# Patient Record
Sex: Female | Born: 1960 | Race: Black or African American | Hispanic: No | Marital: Single | Smoking: Never smoker
Health system: Southern US, Community
[De-identification: ages and names within clinical notes are randomized; demographics above are authoritative.]

## PROBLEM LIST (undated history)

## (undated) DIAGNOSIS — J189 Pneumonia, unspecified organism: Secondary | ICD-10-CM

## (undated) DIAGNOSIS — J690 Pneumonitis due to inhalation of food and vomit: Secondary | ICD-10-CM

## (undated) DIAGNOSIS — I3139 Other pericardial effusion (noninflammatory): Secondary | ICD-10-CM

## (undated) DIAGNOSIS — E0500 Thyrotoxicosis with diffuse goiter without thyrotoxic crisis or storm: Secondary | ICD-10-CM

## (undated) DIAGNOSIS — E039 Hypothyroidism, unspecified: Secondary | ICD-10-CM

## (undated) DIAGNOSIS — I313 Pericardial effusion (noninflammatory): Secondary | ICD-10-CM

## (undated) HISTORY — PX: COLONOSCOPY: SHX174

---

## 1989-03-22 HISTORY — PX: DIAGNOSTIC LAPAROSCOPY WITH REMOVAL OF ECTOPIC PREGNANCY: SHX6449

## 2014-06-21 ENCOUNTER — Other Ambulatory Visit: Payer: Self-pay | Admitting: Obstetrics and Gynecology

## 2014-06-21 DIAGNOSIS — Z1231 Encounter for screening mammogram for malignant neoplasm of breast: Secondary | ICD-10-CM

## 2014-07-19 ENCOUNTER — Encounter (INDEPENDENT_AMBULATORY_CARE_PROVIDER_SITE_OTHER): Payer: Self-pay

## 2014-07-19 ENCOUNTER — Ambulatory Visit
Admission: RE | Admit: 2014-07-19 | Discharge: 2014-07-19 | Disposition: A | Discharge status: Home / Self Care | Payer: 59 | Source: Ambulatory Visit | Attending: Obstetrics and Gynecology | Admitting: Obstetrics and Gynecology

## 2014-07-19 DIAGNOSIS — Z1231 Encounter for screening mammogram for malignant neoplasm of breast: Secondary | ICD-10-CM

## 2014-12-06 ENCOUNTER — Other Ambulatory Visit: Payer: Self-pay | Admitting: Obstetrics and Gynecology

## 2014-12-06 DIAGNOSIS — E049 Nontoxic goiter, unspecified: Secondary | ICD-10-CM

## 2014-12-09 ENCOUNTER — Ambulatory Visit
Admission: RE | Admit: 2014-12-09 | Discharge: 2014-12-09 | Disposition: A | Discharge status: Home / Self Care | Payer: 59 | Source: Ambulatory Visit | Attending: Obstetrics and Gynecology | Admitting: Obstetrics and Gynecology

## 2014-12-09 DIAGNOSIS — E049 Nontoxic goiter, unspecified: Secondary | ICD-10-CM

## 2014-12-13 ENCOUNTER — Other Ambulatory Visit: Payer: Self-pay | Admitting: Internal Medicine

## 2014-12-13 DIAGNOSIS — E041 Nontoxic single thyroid nodule: Secondary | ICD-10-CM

## 2014-12-13 DIAGNOSIS — E0510 Thyrotoxicosis with toxic single thyroid nodule without thyrotoxic crisis or storm: Secondary | ICD-10-CM

## 2014-12-29 ENCOUNTER — Ambulatory Visit
Admission: RE | Admit: 2014-12-29 | Discharge: 2014-12-29 | Disposition: A | Discharge status: Home / Self Care | Payer: 59 | Source: Ambulatory Visit | Attending: Internal Medicine | Admitting: Internal Medicine

## 2014-12-29 ENCOUNTER — Other Ambulatory Visit (HOSPITAL_COMMUNITY)
Admission: RE | Admit: 2014-12-29 | Discharge: 2014-12-29 | Disposition: A | Discharge status: Home / Self Care | Payer: 59 | Source: Ambulatory Visit | Attending: Interventional Radiology | Admitting: Interventional Radiology

## 2014-12-29 DIAGNOSIS — E041 Nontoxic single thyroid nodule: Secondary | ICD-10-CM | POA: Diagnosis present

## 2015-01-03 ENCOUNTER — Encounter (HOSPITAL_COMMUNITY)
Admission: RE | Admit: 2015-01-03 | Discharge: 2015-01-03 | Disposition: A | Discharge status: Home / Self Care | Payer: 59 | Source: Ambulatory Visit | Attending: Internal Medicine | Admitting: Internal Medicine

## 2015-01-03 DIAGNOSIS — E0520 Thyrotoxicosis with toxic multinodular goiter without thyrotoxic crisis or storm: Secondary | ICD-10-CM | POA: Insufficient documentation

## 2015-01-03 DIAGNOSIS — E0510 Thyrotoxicosis with toxic single thyroid nodule without thyrotoxic crisis or storm: Secondary | ICD-10-CM

## 2015-01-04 ENCOUNTER — Encounter (HOSPITAL_COMMUNITY)
Admission: RE | Admit: 2015-01-04 | Discharge: 2015-01-04 | Disposition: A | Discharge status: Home / Self Care | Payer: 59 | Source: Ambulatory Visit | Attending: Internal Medicine | Admitting: Internal Medicine

## 2015-01-04 DIAGNOSIS — E0520 Thyrotoxicosis with toxic multinodular goiter without thyrotoxic crisis or storm: Secondary | ICD-10-CM | POA: Diagnosis not present

## 2015-01-04 DIAGNOSIS — E0510 Thyrotoxicosis with toxic single thyroid nodule without thyrotoxic crisis or storm: Secondary | ICD-10-CM | POA: Diagnosis present

## 2015-01-04 MED ORDER — SODIUM IODIDE I 131 CAPSULE
7.3000 | Freq: Once | INTRAVENOUS | Status: AC | PRN
  Administered 2015-01-04: 7.3 via ORAL

## 2015-01-04 MED ORDER — SODIUM PERTECHNETATE TC 99M INJECTION
10.3000 | Freq: Once | INTRAVENOUS | Status: AC | PRN
  Administered 2015-01-04: 10 via INTRAVENOUS

## 2015-02-13 ENCOUNTER — Encounter: Payer: Self-pay | Admitting: General Surgery

## 2015-02-13 NOTE — Progress Notes (Signed)
Carla Dixon 02/13/2015 11:22 AM Location: [facility] Patient #: [id number] DOB: 08/22/1960 Single / Language: Carla Dixon / Race: Black or African American Female History of Present Illness Carla Hollingshead MD; 02/13/2015 12:20 PM) Patient words: thyroid cancer.  The patient is a 54 year old female    Note:She is referred by Dr. Jeanann Lewandowsky because of a thyroid nodule on the left side that is highly suspicious for papillary thyroid cancer (Bethesda level V) as well as Graves' disease. Her gynecologist noted some fullness in her neck. Ultrasound of the neck demonstrated a diffusely enlarged heterogeneous and hypervascular thyroid gland. It also showed 2 dominant nodules in the left gland. Biopsy was performed. One nodule demonstrated follicular cells and the other demonstrated cells highly concerning for papillary cancer. She has been diagnosed with Graves' disease as her T4 was elevated and TSH was low. Thyroid peroxidase antibody was elevated. She currently is taking Methimazole and has no outward symptoms of hyperthyroidism. No hoarseness. No difficulty with swallowing. No exposure to ionizing radiation at a high-dose. No family history of thyroid disease. She recently moved here from [place].  Other Problems Carla Dixon, [place]; 02/13/2015 11:22 AM) No pertinent past medical history  Past Surgical History Carla Dixon, [place]; 02/13/2015 11:22 AM) Colon Polyp Removal - Colonoscopy  Diagnostic Studies History Carla Dixon, CMA; 02/13/2015 11:22 AM) Colonoscopy 1-5 years ago Mammogram within last year Pap Smear 1-5 years ago  Allergies Carla Dixon, CMA; 02/13/2015 11:24 AM) Neosporin AF *DERMATOLOGICALS* Rash.  Medication History Carla Dixon, CMA; 02/13/2015 11:23 AM) Methimazole (10MG  Tablet, 2 tablets Oral twice daily) Active. Medications Reconciled  Social History Carla Dixon, CMA; 02/13/2015 11:22 AM) Caffeine use Carbonated  beverages, Tea. No alcohol use No drug use Tobacco use Never smoker.  Family History Carla Dixon, [place]; 02/13/2015 11:22 AM) Arthritis Mother. Colon Cancer Father. Colon Polyps Mother. Diabetes Mellitus Mother. Hypertension Brother, Father, Mother, Sister.  Pregnancy / Birth History Carla Dixon, [place]; 02/13/2015 11:22 AM) Age at menarche 76 years. Gravida 1 Irregular periods Maternal age 40-30 Para 0     Review of Systems Carla Dixon CMA; 02/13/2015 11:22 AM) General Present- Fatigue, Night Sweats and Weight Gain. Not Present- Appetite Loss, Chills, Fever and Weight Loss. Skin Present- Rash. Not Present- Change in Wart/Mole, Dryness, Hives, Jaundice, New Lesions, Non-Healing Wounds and Ulcer. HEENT Not Present- Earache, Hearing Loss, Hoarseness, Nose Bleed, Oral Ulcers, Ringing in the Ears, Seasonal Allergies, Sinus Pain, Sore Throat, Visual Disturbances, Wears glasses/contact lenses and Yellow Eyes. Respiratory Not Present- Bloody sputum, Chronic Cough, Difficulty Breathing, Snoring and Wheezing. Breast Not Present- Breast Mass, Breast Pain, Nipple Discharge and Skin Changes. Cardiovascular Not Present- Chest Pain, Difficulty Breathing Lying Down, Leg Cramps, Palpitations, Rapid Heart Rate, Shortness of Breath and Swelling of Extremities. Gastrointestinal Not Present- Abdominal Pain, Bloating, Bloody Stool, Change in Bowel Habits, Chronic diarrhea, Constipation, Difficulty Swallowing, Excessive gas, Gets full quickly at meals, Hemorrhoids, Indigestion, Nausea, Rectal Pain and Vomiting. Female Genitourinary Not Present- Frequency, Nocturia, Painful Urination, Pelvic Pain and Urgency. Musculoskeletal Present- Muscle Weakness. Not Present- Back Pain, Joint Pain, Joint Stiffness, Muscle Pain and Swelling of Extremities. Neurological Not Present- Decreased Memory, Fainting, Headaches, Numbness, Seizures, Tingling, Tremor, Trouble walking and Weakness. Psychiatric Not  Present- Anxiety, Bipolar, Change in Sleep Pattern, Depression, Fearful and Frequent crying. Endocrine Present- Heat Intolerance. Not Present- Cold Intolerance, Excessive Hunger, Hair Changes, Hot flashes and New Diabetes. Hematology Not Present- Easy Bruising, Excessive bleeding, Gland problems, HIV and Persistent Infections.  Vitals Carla Dixon  CMA; 02/13/2015 11:25 AM) 02/13/2015 11:24 AM Weight: 159 lb Height: 58.5in Body Surface Area: 1.73 m Body Mass Index: 32.67 kg/m Temp.: 98.82F(Oral)  Pulse: 64 (Regular)  Resp.: 20 (Unlabored)  BP: 138/70 (Sitting, Left Arm, Standard)     Physical Exam Carla Hollingshead MD; 02/13/2015 12:21 PM)  The physical exam findings are as follows: Note:General: WDWN in NAD. Pleasant and cooperative.  HEENT: [place]/AT, no facial masses  EYES: EOMI, no icterus  NECK: Supple, diffuse thyroid enlargement.  CV: RRR, no murmur, no JVD.  CHEST: Breath sounds equal and clear. Respirations nonlabored.  ABDOMEN: Soft, nontender  MUSCULOSKELETAL: FROM, good muscle tone, no edema, no venous stasis changes  LYMPHATIC: No palpable cervical, supraclavicular adenopathy.  NEUROLOGIC: Alert and oriented, answers questions appropriately, normal gait and station.  PSYCHIATRIC: Normal mood, affect , and behavior.    Assessment & Plan Carla Hollingshead MD; 02/13/2015 11:56 AM)  THYROID NODULE, TOXIC OR WITH HYPERTHYROIDISM (242.10  E05.10) Impression: She has findings consistent with Graves' disease and is on Methimazole. The left thyroid nodule is highly suspicious for papillary thyroid cancer with Bethesda category 5.  [place], I recommended a total thyroidectomy. We'll need potassium iodide preoperatively. We discussed the procedure rationale and risks in detail. The risks of thyroid surgery include but not limited to bleeding, infection, wound healing problems, presence of scar, anesthesia, injury to recurrent laryngeal nerve and permanent  hoarseness, permanent hypocalcemia, voice changes, dysphagia. She seems to understand and agrees with the plan.  Jackolyn Confer, MD

## 2015-03-22 ENCOUNTER — Other Ambulatory Visit: Payer: Self-pay | Admitting: Obstetrics and Gynecology

## 2015-03-22 DIAGNOSIS — N6313 Unspecified lump in the right breast, lower outer quadrant: Secondary | ICD-10-CM

## 2015-03-23 ENCOUNTER — Other Ambulatory Visit (HOSPITAL_COMMUNITY): Payer: Self-pay | Admitting: *Deleted

## 2015-03-23 ENCOUNTER — Encounter (HOSPITAL_COMMUNITY): Payer: Self-pay

## 2015-03-23 ENCOUNTER — Encounter (HOSPITAL_COMMUNITY)
Admission: RE | Admit: 2015-03-23 | Discharge: 2015-03-23 | Disposition: A | Discharge status: Home / Self Care | Payer: 59 | Source: Ambulatory Visit | Attending: General Surgery | Admitting: General Surgery

## 2015-03-23 ENCOUNTER — Ambulatory Visit (HOSPITAL_COMMUNITY)
Admission: RE | Admit: 2015-03-23 | Discharge: 2015-03-23 | Disposition: A | Discharge status: Home / Self Care | Payer: 59 | Source: Ambulatory Visit | Attending: Anesthesiology | Admitting: Anesthesiology

## 2015-03-23 ENCOUNTER — Other Ambulatory Visit (HOSPITAL_COMMUNITY): Payer: 59

## 2015-03-23 DIAGNOSIS — Z01812 Encounter for preprocedural laboratory examination: Secondary | ICD-10-CM | POA: Insufficient documentation

## 2015-03-23 DIAGNOSIS — C73 Malignant neoplasm of thyroid gland: Secondary | ICD-10-CM

## 2015-03-23 DIAGNOSIS — Z01818 Encounter for other preprocedural examination: Secondary | ICD-10-CM

## 2015-03-23 DIAGNOSIS — E0500 Thyrotoxicosis with diffuse goiter without thyrotoxic crisis or storm: Secondary | ICD-10-CM | POA: Diagnosis not present

## 2015-03-23 DIAGNOSIS — Z79899 Other long term (current) drug therapy: Secondary | ICD-10-CM | POA: Diagnosis not present

## 2015-03-23 HISTORY — DX: Other pericardial effusion (noninflammatory): I31.39

## 2015-03-23 HISTORY — DX: Pneumonia, unspecified organism: J18.9

## 2015-03-23 HISTORY — DX: Pericardial effusion (noninflammatory): I31.3

## 2015-03-23 HISTORY — DX: Pneumonitis due to inhalation of food and vomit: J69.0

## 2015-03-23 LAB — CBC WITH DIFFERENTIAL/PLATELET
BASOS ABS: 0 10*3/uL (ref 0.0–0.1)
Basophils Relative: 1 % (ref 0–1)
EOS PCT: 3 % (ref 0–5)
Eosinophils Absolute: 0.1 10*3/uL (ref 0.0–0.7)
HCT: 43.8 % (ref 36.0–46.0)
HEMOGLOBIN: 14.7 g/dL (ref 12.0–15.0)
LYMPHS PCT: 51 % — AB (ref 12–46)
Lymphs Abs: 2.2 10*3/uL (ref 0.7–4.0)
MCH: 27.1 pg (ref 26.0–34.0)
MCHC: 33.6 g/dL (ref 30.0–36.0)
MCV: 80.7 fL (ref 78.0–100.0)
Monocytes Absolute: 0.3 10*3/uL (ref 0.1–1.0)
Monocytes Relative: 7 % (ref 3–12)
NEUTROS ABS: 1.6 10*3/uL — AB (ref 1.7–7.7)
NEUTROS PCT: 38 % — AB (ref 43–77)
PLATELETS: 335 10*3/uL (ref 150–400)
RBC: 5.43 MIL/uL — AB (ref 3.87–5.11)
RDW: 16.2 % — ABNORMAL HIGH (ref 11.5–15.5)
WBC: 4.2 10*3/uL (ref 4.0–10.5)

## 2015-03-23 LAB — HCG, SERUM, QUALITATIVE: PREG SERUM: NEGATIVE

## 2015-03-23 LAB — COMPREHENSIVE METABOLIC PANEL
ALK PHOS: 279 U/L — AB (ref 38–126)
ALT: 80 U/L — AB (ref 14–54)
AST: 55 U/L — AB (ref 15–41)
Albumin: 4.2 g/dL (ref 3.5–5.0)
Anion gap: 9 (ref 5–15)
BUN: 8 mg/dL (ref 6–20)
CHLORIDE: 103 mmol/L (ref 101–111)
CO2: 25 mmol/L (ref 22–32)
Calcium: 9.3 mg/dL (ref 8.9–10.3)
Creatinine, Ser: 0.69 mg/dL (ref 0.44–1.00)
GFR calc Af Amer: >60 mL/min (ref >60)
Glucose, Bld: 82 mg/dL (ref 65–99)
Potassium: 4.2 mmol/L (ref 3.5–5.1)
Sodium: 137 mmol/L (ref 135–145)
Total Bilirubin: 0.6 mg/dL (ref 0.3–1.2)
Total Protein: 7.3 g/dL (ref 6.5–8.1)

## 2015-03-23 LAB — PROTIME-INR
INR: 1.04 (ref 0.00–1.49)
PROTHROMBIN TIME: 13.8 s (ref 11.6–15.2)

## 2015-03-23 NOTE — Progress Notes (Signed)
Requested last OV notes and lab results from Dr. Foye Spurling office.

## 2015-03-23 NOTE — Progress Notes (Signed)
During PAT appt pt states she had pericardial effusion and a pleural effusion approx 5-6 years ago. Was treated at hospital in [facility], [facility]) - will request records from that visit. Pt denies any recent chest pain or sob. She states she occasionally feels jittery and the heat really bothers her - sweating heavily in the heat.

## 2015-03-23 NOTE — Pre-Procedure Instructions (Signed)
Carla Dixon  03/23/2015     Your procedure is scheduled on Monday, April 03, 2015 at 7:30 AM.   Report to [facility] Entrance "A" Admitting Office at 5:30 AM.   Call this number if you have problems the morning of surgery: [phone]   Any questions prior to day of surgery, please call [phone] between 8 & 4 PM.   Remember:  Do not eat food or drink liquids after midnight, Sunday, 04/02/15.  Take these medicines the morning of surgery with A SIP OF WATER: Tapazole (methmazole), Potassium Iodide   Do not wear jewelry, make-up or nail polish.  Do not wear lotions, powders, or perfumes.  You may wear deodorant.  Do not shave 48 hours prior to surgery.    Do not bring valuables to the hospital.  [facility] is not responsible for any belongings or valuables.  Contacts, dentures or bridgework may not be worn into surgery.  Leave your suitcase in the car.  After surgery it may be brought to your room.  For patients admitted to the hospital, discharge time will be determined by your treatment team.  Special instructions:  [place] - Preparing for Surgery  Before surgery, you can play an important role.  Because skin is not sterile, your skin needs to be as free of germs as possible.  You can reduce the number of germs on you skin by washing with CHG (chlorahexidine gluconate) soap before surgery.  CHG is an antiseptic cleaner which kills germs and bonds with the skin to continue killing germs even after washing.  Please DO NOT use if you have an allergy to CHG or antibacterial soaps.  If your skin becomes reddened/irritated stop using the CHG and inform your nurse when you arrive at Short Stay.  Do not shave (including legs and underarms) for at least 48 hours prior to the first CHG shower.  You may shave your face.  Please follow these instructions carefully:   1.  Shower with CHG Soap the night before surgery and the                                morning of  Surgery.  2.  If you choose to wash your hair, wash your hair first as usual with your       normal shampoo.  3.  After you shampoo, rinse your hair and body thoroughly to remove the                      Shampoo.  4.  Use CHG as you would any other liquid soap.  You can apply chg directly       to the skin and wash gently with scrungie or a clean washcloth.  5.  Apply the CHG Soap to your body ONLY FROM THE NECK DOWN.        Do not use on open wounds or open sores.  Avoid contact with your eyes, ears, mouth and genitals (private parts).  Wash genitals (private parts) with your normal soap.  6.  Wash thoroughly, paying special attention to the area where your surgery        will be performed.  7.  Thoroughly rinse your body with warm water from the neck down.  8.  DO NOT shower/wash with your normal soap after using and rinsing off       the CHG  Soap.  9.  Pat yourself dry with a clean towel.            10.  Wear clean pajamas.            11.  Place clean sheets on your bed the night of your first shower and do not        sleep with pets.  Day of Surgery  Do not apply any lotions the morning of surgery.  Please wear clean clothes to the hospital.    Please read over the following fact sheets that you were given. Pain Booklet, Coughing and Deep Breathing and Surgical Site Infection Prevention

## 2015-03-23 NOTE — Progress Notes (Addendum)
Anesthesia PAT Evaluation: Patient is a 54 year old female scheduled for total thyroidectomy on 04/03/15 by Dr. Zella Richer.  History includes non-smoker, Graves' disease with hyperthyroidism recently with FNA findings suspicious for papillary thyroid carcinoma, ectopic pregnancy surgery. Hospitalization near [facility], [facility]) with pericardial effusion and pleural effusion 5-6 years ago. She reports she presented to her PCP with SOB with minimal exertion. She was then sent to a cardiologist, who ordered an echo showing a pericardial effusion. She was sent straight to the ED for admission and pericardiocentesis. She spent two weeks (some of which in the ICU in "shock") in the hospital and also had to undergo thoracentesis. Reportedly, no etiology was found. She was followed a cardiology for a few years afterwards, but was reportedly released by Dr. Leatha Gilding [phone]) around early 2014.). Internist then was Dr. Tressa Busman.   BMI is consistent with obesity. PCP is currently Dr. Glendon Axe with [facility]. She reports either a visit or testing at [facility] within the past year (she thinks EKG, CXR, and echo). Endocrinologist is Dr. Jeanann Lewandowsky. GYN is Dr. Everett Graff.  PAT Vitals: T 36.8, RR 16, HR 69, BP 139/69, 99% RA. Patient says she is prone to sweat while in the heat, but otherwise no tremors (stopped after starting methimazole), tachy-palpitations, diarrhea, insomnia, SOB, chest pain.   Exam showed a pleasant black female in NAD. HT 4' 10.5". WT 171 lb. No tremor or exopthalmus noted. She does have a short neck with prominent upper teeth. Mallampati appeared to be Class III. Lungs clear. Heart RRR. I did no appreciate a murmur. No pedal edema. She denied history of anesthesia complication or difficult intubation. We did discuss need for GETA for this procedure.   Meds include methimazole. She is to start potassium iodine 10 days prior to  surgery.   12/29/14 left lobe inferior FNA showed atypia of undetermined significant or follicular lesion of undetermined significance. Left lobe superior FNA showed findings suspicious for papillary thyroid carcinoma.   12/09/14 Thyroid U/S: IMPRESSION: 1. Diffusely enlarged, heterogeneous and hypervascular thyroid gland. Recommend clinical correlation for signs and symptoms of hyperthyroidism. 2. There are 2 dominant nodules in the left gland which are very similar in character. Findings meet consensus criteria for biopsy. Ultrasound-guided fine needle aspiration should be considered, as per the consensus statement: Management of Thyroid Nodules Detected at [place]: Society of Radiologists in [place]. Radiology 2005; [id number]. 3. Posterior lobulation of the right thyroid gland versus prominent parathyroid gland. Consider correlation for evidence of hyperparathyroidism.  03/23/15 EKG: NSR, right ventricular conduction delay. Currently, no comparison EKG available.   Any cardiology testing requested from Dr. Monia Pouch, [facility], and [facility]. She denied cardiac cath. She thinks she had a treadmill stress test with her previous cardiologist before Dr. Monia Pouch, so it was > 3 years ago.  03/23/15 CXR: IMPRESSION: Hypoinflation without acute cardiopulmonary disease.  Preoperative labs noted. H/H 14.7/43.8. PLT 335K. PT/INR WNL. Serum pregnancy negative. Cr 0.69. Alk Phos 279 (38-126), AST 55 (15-41), ALT 80 (14-54). I will attempt to get comparison labs from [facility]. Endocrinology records indicate that her last TSH around May or June 2016 was 0.004.   Prior to her PAT visit, I discussed her hyperthyroid history and planned procedure with anesthesiologist Dr. Ermalene Postin. Would not plan to repeat TSH or add additional medication if she was overall asymptomatic from her hyperthyroidism--she is is.   Chart will be left for follow-up  regarding her previous cardiology records and comparison labs as available.  George Hugh [facility] Short Stay Center/Anesthesiology Phone [phone] 03/23/2015 5:20 PM  Addendum: Records from [facility] received. She was admitted on 10/03/08 for moderate sized pericardial effusion without tamponade and underwent pericardiocentesis 10/04/08. She underwent left thoracentesis 10/08/08. She had a viral illness the week prior. Records show that pericardial fluid pathology was negative, and left pleural fluid morphology and staining pattern in general favored a population of mesothelial calls and histiocytes. (Pathologist recommended that if fluid re-accumulates or there is any concern for neoplasm, then additional fluid for review may be helpful.) She was discharged home on 10/10/08 on Indomethacin with PCP (Dr. Pollyann Samples) and cardiology (Dr. Lorenda Hatchet) follow-up.   Other records are still pending.  George Hugh [facility] Short Stay Center/Anesthesiology Phone [phone] 03/28/2015 11:52 AM  Addendum: Records received from [facility].   12/14/14 Echo: LV size, wall thickness, and systolic function are normal. EF 60-65%. Normal valve anatomy. Mild TR. Mild pulmonary hypertension. RVSP as measured by Doppler is 37.94 mmHg. No intracardiac mass or pericardial effusion. No intracardiac shunts by 2D and color flow imaging. Normal thoracic aorta and aortic arch.   12/07/14 Spirometry: FVC 1.45 (68%), FEV1 1.16 (68%), FEF25-75% 1.17 (62%). Low vital capacity possibly due to restriction of lung volume.   [facility] labs from 12/21/14: Thyroid peroxidase was elevated at 458.) Thyroglobulin antibody was < 1.0. Total bilirubin 0.5, Alk Phos 196, AST 33, ALT 49. Hep A IgM and Hep B Core Igm were non-reactive on 12/21/14. Her Hep C total antibody was 0.16 which fell within the normal range of 0.00-1.00.  Dr. Tamala Julian had noted patient had elevated LFTs. Denied ETOH or known gallbladder  issues. She has no known family history of liver disease. She has not been taking NSAIDS or Tylenol. She is on Tapazole which side effect profile listing hepatotoxicity as a serious reaction and jaundice as a common reaction. Patient was suppose to have an abdominal ultrasound, but patient said she canceled this appointment due to all the issues going on with her thyroid. I reviewed above with anesthesiologist Dr. Jenita Seashore who recommends to patient's PCP review labs to determine if liver function felt okay to undergo surgery. I notified patient, and told her that she may need to be seen or have the abdominal ultrasound prior to surgery, but that we would defer recommendations to her PCP. Unfortunately, Dr. Tamala Julian is out this week. I spoke with Vaughan Basta who will have Dr. Luvenia Starch review.  George Hugh [facility] Short Stay Center/Anesthesiology Phone [phone] 03/29/2015 3:56 PM  Addendum: I received voice message from [place] at [facility]. She had Dr. Ellouise Newer review labs. Patient is going over there this afternoon for a STAT HFP and abdominal U/S. Further recommendations to be made at that time. I am out of the office tomorrow, but I will leave chart for Jessen Siegman, FNP-BC to follow-up. I'll also send a staff message to Dr. Zella Richer.  George Hugh [facility] Short Stay Center/Anesthesiology Phone [phone] 03/30/2015 3:53 PM  Addendum: Vaughan Basta from [facility] called to report pt had an abdominal US 03/30/15 and an abdominal CT 03/31/15. Dr. Ellouise Newer has cleared pt for surgery based on these results. I asked Vaughan Basta to fax a clearance to [place] for our records.   If no changes, I anticipate pt can proceed with surgery as scheduled.   Willeen Cass, FNP-BC [facility] Short Stay Surgical Center/Anesthesiology Phone: [phone] 03/31/2015 4:45 PM

## 2015-03-30 ENCOUNTER — Ambulatory Visit
Admission: RE | Admit: 2015-03-30 | Discharge: 2015-03-30 | Disposition: A | Discharge status: Home / Self Care | Payer: 59 | Source: Ambulatory Visit | Attending: Obstetrics and Gynecology | Admitting: Obstetrics and Gynecology

## 2015-03-30 DIAGNOSIS — N6313 Unspecified lump in the right breast, lower outer quadrant: Secondary | ICD-10-CM

## 2015-04-02 MED ORDER — CEFAZOLIN SODIUM-DEXTROSE 2-3 GM-% IV SOLR
2.0000 g | INTRAVENOUS | Status: AC
  Administered 2015-04-03: 2 g via INTRAVENOUS
  Filled 2015-04-02: qty 50

## 2015-04-03 ENCOUNTER — Ambulatory Visit (HOSPITAL_COMMUNITY): Payer: 59 | Admitting: Vascular Surgery

## 2015-04-03 ENCOUNTER — Encounter (HOSPITAL_COMMUNITY): Payer: Self-pay | Admitting: General Practice

## 2015-04-03 ENCOUNTER — Ambulatory Visit (HOSPITAL_COMMUNITY): Payer: 59 | Admitting: Anesthesiology

## 2015-04-03 ENCOUNTER — Observation Stay (HOSPITAL_COMMUNITY)
Admission: RE | Admit: 2015-04-03 | Discharge: 2015-04-05 | Disposition: A | Discharge status: Home / Self Care | Payer: 59 | Source: Ambulatory Visit | Attending: General Surgery | Admitting: General Surgery

## 2015-04-03 ENCOUNTER — Encounter (HOSPITAL_COMMUNITY)
Admission: RE | Disposition: A | Discharge status: Home / Self Care | Payer: Self-pay | Source: Ambulatory Visit | Attending: General Surgery

## 2015-04-03 DIAGNOSIS — E0500 Thyrotoxicosis with diffuse goiter without thyrotoxic crisis or storm: Secondary | ICD-10-CM | POA: Diagnosis present

## 2015-04-03 DIAGNOSIS — Z23 Encounter for immunization: Secondary | ICD-10-CM | POA: Diagnosis not present

## 2015-04-03 HISTORY — PX: TOTAL THYROIDECTOMY: SHX2547

## 2015-04-03 HISTORY — PX: THYROIDECTOMY: SHX17

## 2015-04-03 HISTORY — DX: Hypothyroidism, unspecified: E03.9

## 2015-04-03 HISTORY — DX: Thyrotoxicosis with diffuse goiter without thyrotoxic crisis or storm: E05.00

## 2015-04-03 LAB — BASIC METABOLIC PANEL
Anion gap: 11 (ref 5–15)
BUN: 5 mg/dL — AB (ref 6–20)
CALCIUM: 7.5 mg/dL — AB (ref 8.9–10.3)
CO2: 25 mmol/L (ref 22–32)
CREATININE: 0.57 mg/dL (ref 0.44–1.00)
Chloride: 98 mmol/L — ABNORMAL LOW (ref 101–111)
GFR calc Af Amer: >60 mL/min (ref >60)
Glucose, Bld: 154 mg/dL — ABNORMAL HIGH (ref 65–99)
Potassium: 4.1 mmol/L (ref 3.5–5.1)
SODIUM: 134 mmol/L — AB (ref 135–145)

## 2015-04-03 SURGERY — THYROIDECTOMY
Anesthesia: General | Site: Neck

## 2015-04-03 MED ORDER — FENTANYL CITRATE (PF) 250 MCG/5ML IJ SOLN
INTRAMUSCULAR | Status: AC
  Filled 2015-04-03: qty 5

## 2015-04-03 MED ORDER — PROMETHAZINE HCL 25 MG/ML IJ SOLN
6.2500 mg | INTRAMUSCULAR | Status: DC | PRN
Start: 2015-04-03 — End: 2015-04-03

## 2015-04-03 MED ORDER — GLYCOPYRROLATE 0.2 MG/ML IJ SOLN
INTRAMUSCULAR | Status: DC | PRN
  Administered 2015-04-03: 0.4 mg via INTRAVENOUS

## 2015-04-03 MED ORDER — SODIUM CHLORIDE 0.9 % IJ SOLN
INTRAMUSCULAR | Status: AC
  Filled 2015-04-03: qty 10

## 2015-04-03 MED ORDER — INFLUENZA VAC SPLIT QUAD 0.5 ML IM SUSY
0.5000 mL | PREFILLED_SYRINGE | INTRAMUSCULAR | Status: AC
  Administered 2015-04-04: 0.5 mL via INTRAMUSCULAR
  Filled 2015-04-03: qty 0.5

## 2015-04-03 MED ORDER — FENTANYL CITRATE (PF) 100 MCG/2ML IJ SOLN
INTRAMUSCULAR | Status: DC | PRN
  Administered 2015-04-03: 100 ug via INTRAVENOUS
  Administered 2015-04-03 (×2): 50 ug via INTRAVENOUS

## 2015-04-03 MED ORDER — LIDOCAINE HCL 4 % MT SOLN
OROMUCOSAL | Status: DC | PRN
  Administered 2015-04-03: 4 mL via TOPICAL

## 2015-04-03 MED ORDER — MIDAZOLAM HCL 2 MG/2ML IJ SOLN
INTRAMUSCULAR | Status: AC
  Filled 2015-04-03: qty 4

## 2015-04-03 MED ORDER — CALCIUM CARBONATE-VITAMIN D 500-200 MG-UNIT PO TABS
2.0000 | ORAL_TABLET | Freq: Three times a day (TID) | ORAL | Status: DC
  Administered 2015-04-03: 2 via ORAL
  Filled 2015-04-03: qty 2

## 2015-04-03 MED ORDER — ONDANSETRON 4 MG PO TBDP: 4.0000 mg | ORAL_TABLET | Freq: Four times a day (QID) | ORAL | Status: DC | PRN

## 2015-04-03 MED ORDER — EPHEDRINE SULFATE 50 MG/ML IJ SOLN
INTRAMUSCULAR | Status: AC
  Filled 2015-04-03: qty 1

## 2015-04-03 MED ORDER — SUCCINYLCHOLINE CHLORIDE 20 MG/ML IJ SOLN
INTRAMUSCULAR | Status: DC | PRN
  Administered 2015-04-03: 80 mg via INTRAVENOUS

## 2015-04-03 MED ORDER — SODIUM CHLORIDE 0.9 % IV SOLN
2.0000 g | Freq: Once | INTRAVENOUS | Status: AC
  Administered 2015-04-03: 2 g via INTRAVENOUS
  Filled 2015-04-03: qty 20

## 2015-04-03 MED ORDER — HYDROMORPHONE HCL 1 MG/ML IJ SOLN
0.2500 mg | INTRAMUSCULAR | Status: DC | PRN
  Administered 2015-04-03 (×2): 0.5 mg via INTRAVENOUS

## 2015-04-03 MED ORDER — 0.9 % SODIUM CHLORIDE (POUR BTL) OPTIME
TOPICAL | Status: DC | PRN
  Administered 2015-04-03: 1000 mL

## 2015-04-03 MED ORDER — ARTIFICIAL TEARS OP OINT
TOPICAL_OINTMENT | OPHTHALMIC | Status: DC | PRN
  Administered 2015-04-03: 1 via OPHTHALMIC

## 2015-04-03 MED ORDER — SUCCINYLCHOLINE CHLORIDE 20 MG/ML IJ SOLN
INTRAMUSCULAR | Status: AC
  Filled 2015-04-03: qty 1

## 2015-04-03 MED ORDER — NEOSTIGMINE METHYLSULFATE 10 MG/10ML IV SOLN
INTRAVENOUS | Status: AC
  Filled 2015-04-03: qty 1

## 2015-04-03 MED ORDER — ONDANSETRON HCL 4 MG/2ML IJ SOLN: 4.0000 mg | INTRAMUSCULAR | Status: DC | PRN

## 2015-04-03 MED ORDER — ARTIFICIAL TEARS OP OINT
TOPICAL_OINTMENT | OPHTHALMIC | Status: AC
  Filled 2015-04-03: qty 3.5

## 2015-04-03 MED ORDER — OXYCODONE HCL 5 MG PO TABS
5.0000 mg | ORAL_TABLET | ORAL | Status: DC | PRN
  Administered 2015-04-03 – 2015-04-04 (×4): 5 mg via ORAL
  Administered 2015-04-05: 10 mg via ORAL
  Filled 2015-04-03 (×2): qty 1
  Filled 2015-04-03 (×2): qty 2
  Filled 2015-04-03: qty 1
  Filled 2015-04-03: qty 2

## 2015-04-03 MED ORDER — GLYCOPYRROLATE 0.2 MG/ML IJ SOLN
INTRAMUSCULAR | Status: AC
  Filled 2015-04-03: qty 2

## 2015-04-03 MED ORDER — KCL IN DEXTROSE-NACL 20-5-0.45 MEQ/L-%-% IV SOLN
INTRAVENOUS | Status: AC
  Administered 2015-04-03: 1000 mL
  Filled 2015-04-03: qty 1000

## 2015-04-03 MED ORDER — CALCIUM CARBONATE 1250 MG/5ML PO SUSP
1000.0000 mg | Freq: Three times a day (TID) | ORAL | Status: DC
  Administered 2015-04-03: 1000 mg via ORAL
  Filled 2015-04-03 (×2): qty 5

## 2015-04-03 MED ORDER — NEOSTIGMINE METHYLSULFATE 10 MG/10ML IV SOLN
INTRAVENOUS | Status: DC | PRN
  Administered 2015-04-03: 3 mg via INTRAVENOUS

## 2015-04-03 MED ORDER — OXYCODONE HCL 5 MG PO TABS: 5.0000 mg | ORAL_TABLET | Freq: Once | ORAL | Status: AC | PRN

## 2015-04-03 MED ORDER — HEMOSTATIC AGENTS (NO CHARGE) OPTIME
TOPICAL | Status: DC | PRN
  Administered 2015-04-03: 1

## 2015-04-03 MED ORDER — ROCURONIUM BROMIDE 100 MG/10ML IV SOLN
INTRAVENOUS | Status: DC | PRN
  Administered 2015-04-03: 40 mg via INTRAVENOUS
  Administered 2015-04-03: 10 mg via INTRAVENOUS

## 2015-04-03 MED ORDER — LEVOTHYROXINE SODIUM 25 MCG PO TABS
125.0000 ug | ORAL_TABLET | Freq: Every day | ORAL | Status: DC
  Administered 2015-04-04 – 2015-04-05 (×2): 125 ug via ORAL
  Filled 2015-04-03 (×4): qty 1

## 2015-04-03 MED ORDER — OXYCODONE HCL 5 MG/5ML PO SOLN
ORAL | Status: AC
  Filled 2015-04-03: qty 5

## 2015-04-03 MED ORDER — ONDANSETRON HCL 4 MG/2ML IJ SOLN
INTRAMUSCULAR | Status: AC
  Filled 2015-04-03: qty 2

## 2015-04-03 MED ORDER — PHENYLEPHRINE HCL 10 MG/ML IJ SOLN
10.0000 mg | INTRAVENOUS | Status: DC | PRN
  Administered 2015-04-03: 10 ug/min via INTRAVENOUS

## 2015-04-03 MED ORDER — PROPOFOL 10 MG/ML IV BOLUS
INTRAVENOUS | Status: DC | PRN
  Administered 2015-04-03: 20 mg via INTRAVENOUS
  Administered 2015-04-03: 130 mg via INTRAVENOUS

## 2015-04-03 MED ORDER — KCL IN DEXTROSE-NACL 20-5-0.9 MEQ/L-%-% IV SOLN
INTRAVENOUS | Status: DC
  Administered 2015-04-03 – 2015-04-04 (×2): via INTRAVENOUS
  Filled 2015-04-03 (×3): qty 1000

## 2015-04-03 MED ORDER — MORPHINE SULFATE (PF) 2 MG/ML IV SOLN: 2.0000 mg | INTRAVENOUS | Status: DC | PRN

## 2015-04-03 MED ORDER — LACTATED RINGERS IV SOLN
INTRAVENOUS | Status: DC | PRN
  Administered 2015-04-03 (×2): via INTRAVENOUS

## 2015-04-03 MED ORDER — HYDROMORPHONE HCL 1 MG/ML IJ SOLN
INTRAMUSCULAR | Status: AC
  Filled 2015-04-03: qty 1

## 2015-04-03 MED ORDER — LIDOCAINE HCL (CARDIAC) 20 MG/ML IV SOLN
INTRAVENOUS | Status: DC | PRN
  Administered 2015-04-03: 100 mg via INTRAVENOUS

## 2015-04-03 MED ORDER — PROPOFOL 10 MG/ML IV BOLUS
INTRAVENOUS | Status: AC
  Filled 2015-04-03: qty 20

## 2015-04-03 MED ORDER — ONDANSETRON HCL 4 MG/2ML IJ SOLN
INTRAMUSCULAR | Status: DC | PRN
  Administered 2015-04-03: 4 mg via INTRAVENOUS

## 2015-04-03 MED ORDER — ROCURONIUM BROMIDE 50 MG/5ML IV SOLN
INTRAVENOUS | Status: AC
  Filled 2015-04-03: qty 1

## 2015-04-03 MED ORDER — DEXAMETHASONE SODIUM PHOSPHATE 10 MG/ML IJ SOLN
INTRAMUSCULAR | Status: AC
  Filled 2015-04-03: qty 1

## 2015-04-03 MED ORDER — OXYCODONE HCL 5 MG/5ML PO SOLN
5.0000 mg | Freq: Once | ORAL | Status: AC | PRN
  Administered 2015-04-03: 5 mg via ORAL

## 2015-04-03 MED ORDER — CHOLECALCIFEROL 400 UNIT/ML PO LIQD
400.0000 [IU] | Freq: Three times a day (TID) | ORAL | Status: DC
  Administered 2015-04-03 – 2015-04-05 (×5): 400 [IU] via ORAL
  Filled 2015-04-03 (×7): qty 1

## 2015-04-03 MED ORDER — MIDAZOLAM HCL 5 MG/5ML IJ SOLN
INTRAMUSCULAR | Status: DC | PRN
  Administered 2015-04-03: 1 mg via INTRAVENOUS

## 2015-04-03 MED ORDER — PHENYLEPHRINE 40 MCG/ML (10ML) SYRINGE FOR IV PUSH (FOR BLOOD PRESSURE SUPPORT)
PREFILLED_SYRINGE | INTRAVENOUS | Status: AC
  Filled 2015-04-03: qty 10

## 2015-04-03 SURGICAL SUPPLY — 52 items
BENZOIN TINCTURE PRP APPL 2/3 (GAUZE/BANDAGES/DRESSINGS) ×3 IMPLANT
BLADE SURG 10 STRL SS (BLADE) ×3 IMPLANT
BLADE SURG 15 STRL LF DISP TIS (BLADE) ×1 IMPLANT
BLADE SURG 15 STRL SS (BLADE) ×2
BLADE SURG ROTATE 9660 (MISCELLANEOUS) IMPLANT
CANISTER SUCTION 2500CC (MISCELLANEOUS) ×3 IMPLANT
CHLORAPREP W/TINT 10.5 ML (MISCELLANEOUS) ×3 IMPLANT
CLIP TI MEDIUM 24 (CLIP) ×6 IMPLANT
CLIP TI WIDE RED SMALL 24 (CLIP) ×6 IMPLANT
CLOSURE WOUND 1/2 X4 (GAUZE/BANDAGES/DRESSINGS) ×1
COVER SURGICAL LIGHT HANDLE (MISCELLANEOUS) ×3 IMPLANT
CRADLE DONUT ADULT HEAD (MISCELLANEOUS) ×3 IMPLANT
DRAPE PED LAPAROTOMY (DRAPES) ×3 IMPLANT
DRAPE UTILITY XL STRL (DRAPES) ×6 IMPLANT
ELECT CAUTERY BLADE 6.4 (BLADE) ×3 IMPLANT
ELECT REM PT RETURN 9FT ADLT (ELECTROSURGICAL) ×3
ELECTRODE REM PT RTRN 9FT ADLT (ELECTROSURGICAL) ×1 IMPLANT
GAUZE SPONGE 4X4 12PLY STRL (GAUZE/BANDAGES/DRESSINGS) ×3 IMPLANT
GAUZE SPONGE 4X4 16PLY XRAY LF (GAUZE/BANDAGES/DRESSINGS) ×12 IMPLANT
GLOVE BIOGEL PI IND STRL 6.5 (GLOVE) ×1 IMPLANT
GLOVE BIOGEL PI IND STRL 8 (GLOVE) ×1 IMPLANT
GLOVE BIOGEL PI IND STRL 8.5 (GLOVE) ×1 IMPLANT
GLOVE BIOGEL PI INDICATOR 6.5 (GLOVE) ×2
GLOVE BIOGEL PI INDICATOR 8 (GLOVE) ×2
GLOVE BIOGEL PI INDICATOR 8.5 (GLOVE) ×2
GLOVE ECLIPSE 8.0 STRL XLNG CF (GLOVE) ×6 IMPLANT
GLOVE SURG ORTHO 8.0 STRL STRW (GLOVE) ×3 IMPLANT
GLOVE SURG SS PI 6.5 STRL IVOR (GLOVE) ×3 IMPLANT
GOWN STRL REUS W/ TWL LRG LVL3 (GOWN DISPOSABLE) ×3 IMPLANT
GOWN STRL REUS W/TWL LRG LVL3 (GOWN DISPOSABLE) ×6
HEMOSTAT SURGICEL 2X4 FIBR (HEMOSTASIS) ×3 IMPLANT
KIT BASIN OR (CUSTOM PROCEDURE TRAY) ×3 IMPLANT
KIT ROOM TURNOVER OR (KITS) ×3 IMPLANT
NS IRRIG 1000ML POUR BTL (IV SOLUTION) ×3 IMPLANT
PACK SURGICAL SETUP 50X90 (CUSTOM PROCEDURE TRAY) ×3 IMPLANT
PAD ARMBOARD 7.5X6 YLW CONV (MISCELLANEOUS) ×3 IMPLANT
PENCIL BUTTON HOLSTER BLD 10FT (ELECTRODE) ×3 IMPLANT
SHEARS HARMONIC 9CM CVD (BLADE) ×3 IMPLANT
SPECIMEN JAR MEDIUM (MISCELLANEOUS) ×3 IMPLANT
SPONGE INTESTINAL PEANUT (DISPOSABLE) ×12 IMPLANT
STRIP CLOSURE SKIN 1/2X4 (GAUZE/BANDAGES/DRESSINGS) ×2 IMPLANT
SUT MON AB 4-0 PC3 18 (SUTURE) ×3 IMPLANT
SUT SILK 2 0 (SUTURE) ×4
SUT SILK 2-0 18XBRD TIE 12 (SUTURE) ×2 IMPLANT
SUT SILK 3 0 (SUTURE) ×2
SUT SILK 3-0 18XBRD TIE 12 (SUTURE) ×1 IMPLANT
SUT VIC AB 3-0 SH 18 (SUTURE) ×9 IMPLANT
SYR BULB 3OZ (MISCELLANEOUS) ×3 IMPLANT
TOWEL OR 17X24 6PK STRL BLUE (TOWEL DISPOSABLE) ×3 IMPLANT
TOWEL OR 17X26 10 PK STRL BLUE (TOWEL DISPOSABLE) ×3 IMPLANT
TUBE CONNECTING 12'X1/4 (SUCTIONS) ×1
TUBE CONNECTING 12X1/4 (SUCTIONS) ×2 IMPLANT

## 2015-04-03 NOTE — Anesthesia Procedure Notes (Signed)
Procedure Name: Intubation Date/Time: 04/03/2015 7:38 AM Performed by: Izora Gala Pre-anesthesia Checklist: Patient identified, Emergency Drugs available, Suction available and Patient being monitored Patient Re-evaluated:Patient Re-evaluated prior to inductionOxygen Delivery Method: Circle system utilized Preoxygenation: Pre-oxygenation with 100% oxygen Intubation Type: IV induction Ventilation: Mask ventilation without difficulty Laryngoscope Size: Glidescope and 2 (Elective Glidescope.  Poor ROM, prominent teeth ,  Mallampati 3) Grade View: Grade II Tube type: Oral Tube size: 6.5 mm Number of attempts: 1 Airway Equipment and Method: Stylet and LTA kit utilized Placement Confirmation: ETT inserted through vocal cords under direct vision,  positive ETCO2 and breath sounds checked- equal and bilateral Secured at: 22 cm Tube secured with: Tape Dental Injury: Bloody posterior oropharynx  Difficulty Due To: Difficulty was anticipated, Difficult Airway- due to reduced neck mobility and Difficult Airway- due to dentition Future Recommendations: Recommend- induction with short-acting agent, and alternative techniques readily available

## 2015-04-03 NOTE — Anesthesia Postprocedure Evaluation (Signed)
  Anesthesia Post-op Note  Patient: Carla Dixon  Procedure(s) Performed: Procedure(s): TOTAL THYROIDECTOMY (N/A)  Patient Location: PACU  Anesthesia Type:General  Level of Consciousness: awake and alert   Airway and Oxygen Therapy: Patient Spontanous Breathing  Post-op Pain: mild  Post-op Assessment: Post-op Vital signs reviewed              Post-op Vital Signs: Reviewed  Last Vitals:  Filed Vitals:   04/03/15 1338  BP: 132/84  Pulse: 83  Temp: 36.8 C  Resp: 12    Complications: No apparent anesthesia complications

## 2015-04-03 NOTE — Progress Notes (Signed)
Lunch relief by T Citty RN 

## 2015-04-03 NOTE — Anesthesia Preprocedure Evaluation (Addendum)
Anesthesia Evaluation  Patient identified by MRN, date of birth, ID band Patient awake    Reviewed: Allergy & Precautions, NPO status , Patient's Chart, lab work & pertinent test results  Airway Mallampati: III  TM Distance: <3 FB Neck ROM: Full    Dental  (+) Dental Advisory Given   Pulmonary neg pulmonary ROS,    breath sounds clear to auscultation       Cardiovascular negative cardio ROS   Rhythm:Regular Rate:Normal     Neuro/Psych negative neurological ROS     GI/Hepatic negative GI ROS, Neg liver ROS,   Endo/Other  Hyperthyroidism Morbid obesity  Renal/GU negative Renal ROS     Musculoskeletal   Abdominal   Peds  Hematology negative hematology ROS (+)   Anesthesia Other Findings   Reproductive/Obstetrics                            Anesthesia Physical Anesthesia Plan  ASA: II  Anesthesia Plan: General   Post-op Pain Management:    Induction: Intravenous  Airway Management Planned: Oral ETT  Additional Equipment:   Intra-op Plan:   Post-operative Plan: Extubation in OR  Informed Consent: I have reviewed the patients History and Physical, chart, labs and discussed the procedure including the risks, benefits and alternatives for the proposed anesthesia with the patient or authorized representative who has indicated his/her understanding and acceptance.   Dental advisory given  Plan Discussed with: CRNA  Anesthesia Plan Comments:         Anesthesia Quick Evaluation

## 2015-04-03 NOTE — Op Note (Signed)
Operative Note  Carla Dixon female 54 y.o. 04/03/2015  PREOPERATIVE DX:  Graves' disease with bilateral follicular lesions of the thyroid, left lesion Bethesda category 5  POSTOPERATIVE DX:  Same  PROCEDURE:   Total thyroidectomy         Surgeon: Odis Hollingshead   Assistants: Armandina Gemma, MD  Anesthesia: General endotracheal anesthesia  Indications:   This is a 54 year old female with thyromegaly, Graves' disease, and 2 thyroid nodules one on the right and one on the left. FNA of the right sided nodule demonstrated a follicular lesion of unknown significance. FNA of the left-sided lesion demonstrated findings highly suspicious for papillary carcinoma. She is adequately suppressed. She now presents for thyroidectomy.    Procedure Detail:  She was brought to the operating room placed supine on the operating table in the general anesthetic was given. The neck was placed in slight extension. The neck and upper chest were sterilely prepped and draped.  A transverse incision was made in the inferior aspect of the neck through the skin, subcutaneous tissue, and platysma. Sub-platysma flaps were then raised to the thyroid cartilage superiorly and the suprasternal notch inferiorly. The fascia dividing the strap muscles was divided. Beginning on the left side, I dissected the strap muscles free from the enlarged left lobe of the thyroid. The left lobe was densely adherent to the strap muscles consistent with an inflammatory reponse. I subsequently mobilized the superior pole. The superior vessels were isolated close to the gland and then sequentially divided between clips and using the Harmonic scalpel. Next, the inferior lobe of the gland was approached. Staying close to the thyroid gland, small vessels were divided using clips and tjr Harmonic scalpel. There is significant inflammatory changes noted involving the left lobe of the thyroid gland. Middle thyroid vessels were divided on the thyroid  capsule between clips and the Harmonic scalpel. The gland was rotated medially. The superior parathyroid gland was identified and preserved. The left recurrent laryngeal nerve was identified. A small amount of gland which densely adherent to the nerve. Subsequently I began mobilizing the gland free from the trachea leaving a small amount of thyroid tissue on the recurrent laryngeal nerve. The gland was densely adherent to the trachea as well. There was a pyramidal lobe which was dissected free from the trachea and mobilized.  Next the right side was approached. There were a few other inflammatory adhesions between the enlarged right lobe of the thyroid gland and the strap muscles. Superior pole was mobilized by isolating the vessels and divided them between clips and using the Harmonic scalpel. Next the inferior aspect of the gland was mobilized dividing small vessels between clips and Harmonic scalpel. All dissection was on the capsule of the gland. There is a small extension of the gland posteriorly and this was carefully mobilized. The right recurrent laryngeal nerve was identified and preserved. Right inferior parathyroid gland was isolated and preserved. The medial aspect of the gland was mobilized. The gland was very vascular on both sides. Small vessels were divided between clips and using Harmonic scalpel. I was unable to roll the right lobe of gland medially and dissected free from the airway freeing up the entire thyroid gland. The left superior pole was marked with a suture and the gland was handed off the field to be sent to pathology.  The neck was inspected. Small areas of bleeding were controlled with cautery and clips. Irrigation was performed. At this point, hemostasis was adequate. Surgicel was placed into the wound.  The strap muscles were reapproximated with interrupted 3-0 Vicryl sutures. The platysma muscle was approximated with interrupted 3-0 Vicryl sutures. The skin was closed with a  4-0 Monocryl subcuticular stitch. Steri-Strips and a sterile dressing were applied.  She tolerated the procedure well without any apparent complications. She was taken to the recovery room in satisfactory condition.    Findings:  Dense inflammatory changes were noted  Estimated Blood Loss:  200 mL         Specimens: thyroid gland        Complications:  * No complications entered in OR log *         Disposition: PACU - hemodynamically stable.         Condition: stable

## 2015-04-03 NOTE — Transfer of Care (Signed)
Immediate Anesthesia Transfer of Care Note  Patient: Carla Dixon  Procedure(s) Performed: Procedure(s): TOTAL THYROIDECTOMY (N/A)  Patient Location: PACU  Anesthesia Type:General  Level of Consciousness: awake, alert  and sedated  Airway & Oxygen Therapy: Patient Spontanous Breathing and Patient connected to face mask oxygen  Post-op Assessment: Report given to RN, Post -op Vital signs reviewed and stable and Patient moving all extremities  Post vital signs: Reviewed and stable  Last Vitals:  Filed Vitals:   04/03/15 0657  BP: 126/77  Pulse: 76  Temp: 36.7 C  Resp: 18    Complications: No apparent anesthesia complications

## 2015-04-03 NOTE — H&P (Signed)
Carla Dixon is an 54 y.o. female.   Chief Complaint:   Here for elective surgery. HPI:  She was referred by Dr. Jeanann Lewandowsky because of a thyroid nodule on the left side that is highly suspicious for papillary thyroid cancer (Bethesda level V) as well as Graves' disease. Her gynecologist noted some fullness in her neck. Ultrasound of the neck demonstrated a diffusely enlarged heterogeneous and hypervascular thyroid gland. It also showed 2 dominant nodules in the left gland. Biopsy was performed. One nodule demonstrated follicular cells and the other demonstrated cells highly concerning for papillary cancer. She has been diagnosed with Graves' disease as her T4 was elevated and TSH was low. Thyroid peroxidase antibody was elevated. She currently is taking Methimazole and has no outward symptoms of hyperthyroidism. Past Medical History  Diagnosis Date  . Pericardial effusion   . Pulmonary aspiration of fluid   . Hyperthyroidism   . Pneumonia     Past Surgical History  Procedure Laterality Date  . Ectopic pregnancy surgery    . Dilation and curettage of uterus    . Colonoscopy      Family History  Problem Relation Age of Onset  . Hypertension Mother   . Diabetes Mother   . Gout Mother   . Heart disease Father   . Colon cancer Father    Social History:  reports that she has never smoked. She has never used smokeless tobacco. She reports that she does not drink alcohol or use illicit drugs.  Allergies:  Allergies  Allergen Reactions  . Neosporin Original [Bacitracin-Neomycin-Polymyxin] Rash    Medications Prior to Admission  Medication Sig Dispense Refill  . methimazole (TAPAZOLE) 10 MG tablet Take 20 mg by mouth 2 (two) times daily.  1  . potassium iodide (SSKI) 1 GM/ML solution Take 25 mg by mouth 3 (three) times daily. Start 10 days prior to procedure      No results found for this or any previous visit (from the past 48 hour(s)). No results found.  Review of  Systems  Gastrointestinal: Negative for nausea and vomiting.    Blood pressure 126/77, pulse 76, temperature 98 F (36.7 C), temperature source Oral, resp. rate 18, height 4' 10.5" (1.486 m), weight 77.565 kg (171 lb), SpO2 97 %. Physical Exam  Constitutional: She appears well-developed and well-nourished. No distress.  HENT:  Head: Normocephalic and atraumatic.  Eyes: No scleral icterus.  Neck: Thyromegaly present.  Cardiovascular: Normal rate and regular rhythm.   Respiratory: Effort normal and breath sounds normal.  Musculoskeletal: She exhibits no edema.  Neurological: She is alert.  Psychiatric: She has a normal mood and affect. Her behavior is normal.     Assessment/Plan Graves disease with nodule highly suspicious for papillary cancer.  She is adequately suppressed.  Plan:  Total thyroidectomy.  Koleman Marling J 04/03/2015, 7:17 AM

## 2015-04-04 ENCOUNTER — Encounter (HOSPITAL_COMMUNITY): Payer: Self-pay | Admitting: General Surgery

## 2015-04-04 DIAGNOSIS — E0500 Thyrotoxicosis with diffuse goiter without thyrotoxic crisis or storm: Secondary | ICD-10-CM | POA: Diagnosis not present

## 2015-04-04 LAB — BASIC METABOLIC PANEL
ANION GAP: 11 (ref 5–15)
BUN: 5 mg/dL — ABNORMAL LOW (ref 6–20)
CO2: 26 mmol/L (ref 22–32)
Calcium: 8.4 mg/dL — ABNORMAL LOW (ref 8.9–10.3)
Chloride: 100 mmol/L — ABNORMAL LOW (ref 101–111)
Creatinine, Ser: 0.75 mg/dL (ref 0.44–1.00)
Glucose, Bld: 94 mg/dL (ref 65–99)
POTASSIUM: 4.1 mmol/L (ref 3.5–5.1)
SODIUM: 137 mmol/L (ref 135–145)

## 2015-04-04 LAB — BASIC METABOLIC PANEL WITH GFR
Anion gap: 11 (ref 5–15)
BUN: 5 mg/dL — ABNORMAL LOW (ref 6–20)
CO2: 28 mmol/L (ref 22–32)
Calcium: 7.3 mg/dL — ABNORMAL LOW (ref 8.9–10.3)
Chloride: 95 mmol/L — ABNORMAL LOW (ref 101–111)
Creatinine, Ser: 0.81 mg/dL (ref 0.44–1.00)
GFR calc Af Amer: >60 mL/min
GFR calc non Af Amer: >60 mL/min
Glucose, Bld: 128 mg/dL — ABNORMAL HIGH (ref 65–99)
Potassium: 4.1 mmol/L (ref 3.5–5.1)
Sodium: 134 mmol/L — ABNORMAL LOW (ref 135–145)

## 2015-04-04 MED ORDER — CALCIUM CARBONATE 1250 MG/5ML PO SUSP
1500.0000 mg | Freq: Three times a day (TID) | ORAL | Status: DC
  Administered 2015-04-04 (×4): 1500 mg via ORAL
  Filled 2015-04-04 (×4): qty 10

## 2015-04-04 MED ORDER — CALCIUM CARBONATE 1250 MG/5ML PO SUSP
1500.0000 mg | Freq: Three times a day (TID) | ORAL | Status: DC
  Administered 2015-04-05 (×2): 1500 mg via ORAL
  Filled 2015-04-04 (×4): qty 10

## 2015-04-04 MED ORDER — SODIUM CHLORIDE 0.9 % IV SOLN
2.0000 g | Freq: Two times a day (BID) | INTRAVENOUS | Status: AC
  Administered 2015-04-04 (×2): 2 g via INTRAVENOUS
  Filled 2015-04-04 (×2): qty 20

## 2015-04-04 NOTE — Progress Notes (Signed)
1 Day Post-Op  Subjective: Neck is sore.  Swallowing liquids okay.  We discussed the operation.  Objective: Vital signs in last 24 hours: Temp:  [98 F (36.7 C)-98.7 F (37.1 C)] 98.7 F (37.1 C) (09/13 0508) Pulse Rate:  [73-91] 77 (09/13 0508) Resp:  [12-16] 16 (09/13 0508) BP: (123-150)/(70-84) 128/74 mmHg (09/13 0508) SpO2:  [94 %-100 %] 98 % (09/13 0508) Last BM Date: 04/03/15  Intake/Output from previous day: 09/12 0701 - 09/13 0700 In: 3122.5 [P.O.:410; I.V.:2712.5] Out: 430 [Urine:180; Blood:250] Intake/Output this shift:    PE: General- In NAD, slightly hoarse Neck-incision is clean with minimal swelling   Lab Results:  No results for input(s): WBC, HGB, HCT, PLT in the last 72 hours. BMET  Recent Labs  04/03/15 1603 04/04/15 0426  NA 134* 134*  K 4.1 4.1  CL 98* 95*  CO2 25 28  GLUCOSE 154* 128*  BUN 5* 5*  CREATININE 0.57 0.81  CALCIUM 7.5* 7.3*   PT/INR No results for input(s): LABPROT, INR in the last 72 hours. Comprehensive Metabolic Panel:    Component Value Date/Time   NA 134* 04/04/2015 0426   NA 134* 04/03/2015 1603   K 4.1 04/04/2015 0426   K 4.1 04/03/2015 1603   CL 95* 04/04/2015 0426   CL 98* 04/03/2015 1603   CO2 28 04/04/2015 0426   CO2 25 04/03/2015 1603   BUN 5* 04/04/2015 0426   BUN 5* 04/03/2015 1603   CREATININE 0.81 04/04/2015 0426   CREATININE 0.57 04/03/2015 1603   GLUCOSE 128* 04/04/2015 0426   GLUCOSE 154* 04/03/2015 1603   CALCIUM 7.3* 04/04/2015 0426   CALCIUM 7.5* 04/03/2015 1603   AST 55* 03/23/2015 1525   ALT 80* 03/23/2015 1525   ALKPHOS 279* 03/23/2015 1525   BILITOT 0.6 03/23/2015 1525   PROT 7.3 03/23/2015 1525   ALBUMIN 4.2 03/23/2015 1525     Studies/Results: No results found.  Anti-infectives: Anti-infectives    Start     Dose/Rate Route Frequency Ordered Stop   04/03/15 0715  ceFAZolin (ANCEF) IVPB 2 g/50 mL premix     2 g 100 mL/hr over 30 Minutes Intravenous To [facility]  04/02/15 1503 04/03/15 0746      Assessment Principal Problem:   Graves disease and bilateral thyroid nodules s/p total thyroidectomy 04/03/15-HD stable; swallowing liquids okay; hoarseness not unexpected given inflammatory adhesions encountered.    Hypocalcemia      Plan: Increase Calcium replacement and monitor Calcium levels today.  Discharge when Calcium replacement is adequate.   Carla Dixon J 04/04/2015

## 2015-04-05 DIAGNOSIS — E0500 Thyrotoxicosis with diffuse goiter without thyrotoxic crisis or storm: Secondary | ICD-10-CM | POA: Diagnosis not present

## 2015-04-05 LAB — BASIC METABOLIC PANEL
ANION GAP: 9 (ref 5–15)
CO2: 28 mmol/L (ref 22–32)
Calcium: 8.6 mg/dL — ABNORMAL LOW (ref 8.9–10.3)
Chloride: 99 mmol/L — ABNORMAL LOW (ref 101–111)
Creatinine, Ser: 0.73 mg/dL (ref 0.44–1.00)
Glucose, Bld: 112 mg/dL — ABNORMAL HIGH (ref 65–99)
POTASSIUM: 3.8 mmol/L (ref 3.5–5.1)
SODIUM: 136 mmol/L (ref 135–145)

## 2015-04-05 MED ORDER — LEVOTHYROXINE SODIUM 125 MCG PO TABS: 125.0000 ug | ORAL_TABLET | Freq: Every day | ORAL | Status: DC

## 2015-04-05 MED ORDER — CALCIUM CARBONATE-VITAMIN D 500-200 MG-UNIT PO TABS: 3.0000 | ORAL_TABLET | Freq: Three times a day (TID) | ORAL | Status: DC

## 2015-04-05 MED ORDER — OXYCODONE HCL 5 MG PO TABS: 5.0000 mg | ORAL_TABLET | ORAL | Status: DC | PRN

## 2015-04-05 NOTE — Progress Notes (Signed)
2 Days Post-Op  Subjective: Feels better this AM.  Swallowing without difficulty  Objective: Vital signs in last 24 hours: Temp:  [98 F (36.7 C)-99.1 F (37.3 C)] 98 F (36.7 C) (09/14 0408) Pulse Rate:  [75-91] 82 (09/14 0408) Resp:  [16-19] 19 (09/14 0408) BP: (128-149)/(68-77) 143/75 mmHg (09/14 0408) SpO2:  [95 %-100 %] 100 % (09/14 0408) Last BM Date: 04/03/15  Intake/Output from previous day: 09/13 0701 - 09/14 0700 In: 1790 [P.O.:1790] Out: 1150 [Urine:1150] Intake/Output this shift:    PE: General- In NAD, less hoarse Neck-incision is clean with minimal swelling   Lab Results:  No results for input(s): WBC, HGB, HCT, PLT in the last 72 hours. BMET  Recent Labs  04/04/15 1624 04/05/15 0329  NA 137 136  K 4.1 3.8  CL 100* 99*  CO2 26 28  GLUCOSE 94 112*  BUN 5* <5*  CREATININE 0.75 0.73  CALCIUM 8.4* 8.6*   PT/INR No results for input(s): LABPROT, INR in the last 72 hours. Comprehensive Metabolic Panel:    Component Value Date/Time   NA 136 04/05/2015 0329   NA 137 04/04/2015 1624   K 3.8 04/05/2015 0329   K 4.1 04/04/2015 1624   CL 99* 04/05/2015 0329   CL 100* 04/04/2015 1624   CO2 28 04/05/2015 0329   CO2 26 04/04/2015 1624   BUN <5* 04/05/2015 0329   BUN 5* 04/04/2015 1624   CREATININE 0.73 04/05/2015 0329   CREATININE 0.75 04/04/2015 1624   GLUCOSE 112* 04/05/2015 0329   GLUCOSE 94 04/04/2015 1624   CALCIUM 8.6* 04/05/2015 0329   CALCIUM 8.4* 04/04/2015 1624   AST 55* 03/23/2015 1525   ALT 80* 03/23/2015 1525   ALKPHOS 279* 03/23/2015 1525   BILITOT 0.6 03/23/2015 1525   PROT 7.3 03/23/2015 1525   ALBUMIN 4.2 03/23/2015 1525     Studies/Results: No results found.  Anti-infectives: Anti-infectives    Start     Dose/Rate Route Frequency Ordered Stop   04/03/15 0715  ceFAZolin (ANCEF) IVPB 2 g/50 mL premix     2 g 100 mL/hr over 30 Minutes Intravenous To [facility] 04/02/15 1503 04/03/15 0746       Assessment Principal Problem:   Graves disease and bilateral thyroid nodules s/p total thyroidectomy 04/03/15- doing better today.    Hypocalcemia-improved on QID oral calcium      Plan:  Discharge today.  Instructions given to her.  Nathon Stefanski J 04/05/2015

## 2015-04-05 NOTE — Discharge Instructions (Signed)
[facility], [place] [phone]  THYROID/ PARATHYROID SURGERY: POST OP INSTRUCTIONS  Always review your discharge instruction sheet given to you by the facility where your surgery was performed.  IF YOU HAVE DISABILITY OR FAMILY LEAVE FORMS, YOU MUST BRING THEM TO THE OFFICE FOR PROCESSING.  PLEASE DO NOT GIVE THEM TO YOUR DOCTOR.  1. A prescription for pain medication may be given to you upon discharge.  Take your pain medication as prescribed, if needed.  If narcotic pain medicine is not needed, then you may take acetaminophen (Tylenol) or ibuprofen (Advil) as needed. 2. Take your usually prescribed medications unless otherwise directed. 3. If you need a refill on your pain medication, please contact your pharmacy. They will contact our office to request authorization.  Prescriptions will not be filled after 5pm or on week-ends. 4. You should follow a soft diet.  Be sure to include lots of fluids daily.  Resume your normal diet the day after surgery. 5. Most patients will experience some swelling and bruising on the chest and neck area.  Ice packs will help.  Swelling and bruising can take several days to resolve.  6. It is common to experience some constipation if taking pain medication after surgery.  Increasing fluid intake and taking a stool softener will usually help or prevent this problem from occurring.  A mild laxative (Milk of Magnesia or Miralax) should be taken according to package directions if there are no bowel movements after 48 hours. 7. Unless discharge instructions indicate otherwise, you may remove your bandages 48 hours after surgery, and you may shower at that time.  You may have steri-strips (small skin tapes) in place directly over the incision.  These strips should be left on the skin for 14 days.  If your surgeon used skin glue on the incision, you may shower in 24 hours.  The glue will flake off over the next 2-3 weeks.  Any sutures or staples will be  removed at the office during your follow-up visit. 8. ACTIVITIES:  You may resume regular (light) daily activities beginning the next day--such as daily self-care, walking, climbing stairs--gradually increasing activities as tolerated.  You may have sexual intercourse when it is comfortable.  Refrain from any heavy lifting or straining for one week. a. You may drive when you no longer are taking prescription pain medication, you can comfortably wear a seatbelt, and you can safely maneuver your car and apply brakes b. RETURN TO WORK:  __In 1-2 weeks when you are comfortable.________________________________________________________ 9. You should see your doctor in the office for a follow-up appointment approximately 2-3 weeks after your surgery.  Make sure that you call for this appointment within a day or two after you arrive home to insure a convenient appointment time. 10. OTHER INSTRUCTIONS: ____________________________________________________________________________ _________________________________________________________________________________________________________________ _________________________________________________________________________________________________________________   WHEN TO CALL YOUR DOCTOR: 1. Fever over 101.0 2. Inability to urinate 3. Nausea and/or vomiting 4. Extreme swelling or bruising 5. Continued bleeding from incision. 6. Increased pain, redness, or drainage from the incision. 7. Difficulty swallowing or breathing 8. Muscle cramping or spasms. 9. Numbness or tingling in hands or feet or around lips.  The clinic staff is available to answer your questions during regular business hours.  Please dont hesitate to call and ask to speak to one of the nurses if you have concerns.  For further questions, please visit [email or web address]

## 2015-04-05 NOTE — Progress Notes (Signed)
Discharge instructions gone over with patient. Home medications gone over. Follow up appointment to be made. Diet, activity, and reasons to call the doctor gone over. Signs and symptoms of infection and tetany discussed. Incisional care discussed. Patient has my chart. Patient verbalized understanding of instructions.

## 2015-04-10 NOTE — Discharge Summary (Signed)
Physician Discharge Summary  Patient ID: Carla Dixon MRN: [id number] DOB/AGE: [birth year] 54 y.o.  Admit date: 04/03/2015 Discharge date: 04/05/2015  Admission Diagnoses:  Graves disease with bilateral atypical thyroid nodules  Discharge Diagnoses:  Principal Problem:   Graves disease and bilateral benign thyroid nodules s/p total thyroidectomy 04/03/15   Discharged Condition: good  Hospital Course: She underwent the above operation and some hypocalcemia postop which was treated with IV calcium and oral calcium.  Oral calcium was adjusted to keep calcium above 8 and she was able to be discharged on POD  #2.  Discharge instructions were given to her.  She was discharged on Synthroid 125 micrograms a day for thyroid replacement and OsCal with D 1500 mg QID for postop hypocalcemia.  Discharge Exam: Blood pressure 143/75, pulse 82, temperature 98 F (36.7 C), temperature source Oral, resp. rate 19, height 4' 10.5" (1.486 m), weight 77.565 kg (171 lb), SpO2 100 %.   Disposition: 01-Home or Self Care     Medication List    STOP taking these medications        methimazole 10 MG tablet  Commonly known as:  TAPAZOLE     potassium iodide 1 GM/ML solution  Commonly known as:  SSKI      TAKE these medications        calcium-vitamin D 500-200 MG-UNIT per tablet  Commonly known as:  OSCAL WITH D  Take 3 tablets by mouth 4 (four) times daily - after meals and at bedtime.     levothyroxine 125 MCG tablet  Commonly known as:  SYNTHROID, LEVOTHROID  Take 1 tablet (125 mcg total) by mouth daily before breakfast.     oxyCODONE 5 MG immediate release tablet  Commonly known as:  Oxy IR/ROXICODONE  Take 1-2 tablets (5-10 mg total) by mouth every 4 (four) hours as needed for moderate pain.         Signed: Odis Hollingshead 04/10/2015, 9:25 AM

## 2018-01-13 ENCOUNTER — Institutional Professional Consult (permissible substitution): Payer: Self-pay | Admitting: Neurology

## 2018-02-04 ENCOUNTER — Ambulatory Visit (INDEPENDENT_AMBULATORY_CARE_PROVIDER_SITE_OTHER): Payer: 59 | Admitting: Neurology

## 2018-02-04 ENCOUNTER — Encounter: Payer: Self-pay | Admitting: Neurology

## 2018-02-04 VITALS — BP 147/89 | HR 78 | Ht 58.5 in | Wt 203.0 lb

## 2018-02-04 DIAGNOSIS — Z8679 Personal history of other diseases of the circulatory system: Secondary | ICD-10-CM

## 2018-02-04 DIAGNOSIS — R0689 Other abnormalities of breathing: Secondary | ICD-10-CM

## 2018-02-04 DIAGNOSIS — Z6841 Body Mass Index (BMI) 40.0 and over, adult: Secondary | ICD-10-CM | POA: Diagnosis not present

## 2018-02-04 DIAGNOSIS — Z820 Family history of epilepsy and other diseases of the nervous system: Secondary | ICD-10-CM | POA: Diagnosis not present

## 2018-02-04 DIAGNOSIS — G478 Other sleep disorders: Secondary | ICD-10-CM | POA: Diagnosis not present

## 2018-02-04 DIAGNOSIS — R0683 Snoring: Secondary | ICD-10-CM | POA: Diagnosis not present

## 2018-02-04 NOTE — Patient Instructions (Addendum)

## 2018-02-04 NOTE — Progress Notes (Signed)
Subjective:    Patient ID: Jakeya Atkerson is a 57 y.o. female.  HPI     Star Age, MD, PhD [facility] [address] [place], [place] [address]  Dear Dr. Baird Cancer,   I saw your patient, Shanaia Sievers, upon your kind request, in my neurologic clinic today for initial consultation of her sleep disorder, in particular, concern for underlying obstructive sleep apnea. The patient is unaccompanied today. As you know, Ms. Mello is a 57 year old right-handed woman with an underlying medical history of Graves' disease status post thyroidectomy in 03/2015 with postoperative hypothyroidism, vitamin D deficiency, Hx of pericardial effusion, history of pneumonia, and obesity, who reports snoring and excessive daytime somnolence. I reviewed your office note from 11/12/2017, which you kindly included. Her Epworth sleepiness score is 5 out of 24, fatigue score is 16 out of 63. She is single and lives with her family which includes mother, cousins' kids, and sister stays sometimes. Sadly she lost her maternal aunt about 3 weeks ago. She is a nonsmoker and does not utilize alcohol, she drinks caffeine in the form of soda and tea, about 2 servings per day. She works part-time as a Optometrist for [employer]. She has a brother with CPAP. She does not always wake up fully rested. She does not keep a set schedule for her sleep and rise time. Bedtime is generally late, as late as 4 AM, generally in bed around 2. She watches TV in her bedroom and tries to turn the TV off before falling asleep. Rise time is typically between 8 and 10. She does not have nighttime nocturia or morning headaches. She denies restless leg symptoms. She would be willing to get tested for sleep apnea and try CPAP therapy. She is working on weight loss. She's currently on phentermine. She moved to [place] some 4 years ago. She had a home sleep test some 6 years ago with unclear results.   Her Past  Medical History Is Significant For: Past Medical History:  Diagnosis Date  . Graves' disease   . Hypothyroidism    S/P thyroidectomy  . Pericardial effusion   . Pneumonia "several times"  . Pulmonary aspiration of fluid (HCC)     Her Past Surgical History Is Significant For: Past Surgical History:  Procedure Laterality Date  . COLONOSCOPY    . DIAGNOSTIC LAPAROSCOPY WITH REMOVAL OF ECTOPIC PREGNANCY  1990's  . THYROIDECTOMY N/A 04/03/2015   Procedure: TOTAL THYROIDECTOMY;  Surgeon: Jackolyn Confer, MD;  Location: [facility];  Service: General;  Laterality: N/A;  . TOTAL THYROIDECTOMY  04/03/2015    Her Family History Is Significant For: Family History  Problem Relation Age of Onset  . Hypertension Mother   . Diabetes Mother   . Gout Mother   . Heart disease Father   . Colon cancer Father     Her Social History Is Significant For: Social History   Socioeconomic History  . Marital status: Significant Other    Spouse name: Not on file  . Number of children: Not on file  . Years of education: Not on file  . Highest education level: Not on file  Occupational History  . Not on file  Social Needs  . Financial resource strain: Not on file  . Food insecurity:    Worry: Not on file    Inability: Not on file  . Transportation needs:    Medical: Not on file    Non-medical: Not on file  Tobacco  Use  . Smoking status: Never Smoker  . Smokeless tobacco: Never Used  Substance and Sexual Activity  . Alcohol use: No  . Drug use: No  . Sexual activity: Yes  Lifestyle  . Physical activity:    Days per week: Not on file    Minutes per session: Not on file  . Stress: Not on file  Relationships  . Social connections:    Talks on phone: Not on file    Gets together: Not on file    Attends religious service: Not on file    Active member of club or organization: Not on file    Attends meetings of clubs or organizations: Not on file    Relationship status: Not on file  Other  Topics Concern  . Not on file  Social History Narrative  . Not on file    Her Allergies Are:  Allergies  Allergen Reactions  . Neosporin Original [Bacitracin-Neomycin-Polymyxin] Rash  :   Her Current Medications Are:  Outpatient Encounter Medications as of 02/04/2018  Medication Sig  . levothyroxine (SYNTHROID, LEVOTHROID) 125 MCG tablet Take 1 tablet (125 mcg total) by mouth daily before breakfast.  . phentermine 15 MG capsule Take 15 mg by mouth every morning.  . Vitamin D, Ergocalciferol, (DRISDOL) 50000 units CAPS capsule Take 50,000 Units by mouth 2 (two) times a week.  . [DISCONTINUED] calcium-vitamin D (OSCAL WITH D) 500-200 MG-UNIT per tablet Take 3 tablets by mouth 4 (four) times daily - after meals and at bedtime.  . [DISCONTINUED] oxyCODONE (OXY IR/ROXICODONE) 5 MG immediate release tablet Take 1-2 tablets (5-10 mg total) by mouth every 4 (four) hours as needed for moderate pain.   No facility-administered encounter medications on file as of 02/04/2018.   :  Review of Systems:  Out of a complete 14 point review of systems, all are reviewed and negative with the exception of these symptoms as listed below:  Review of Systems  Neurological:       Pt presents today to discuss her sleep. Pt had a HST in the past but never received the results. Pt does endorse snoring.  Epworth Sleepiness Scale 0= would never doze 1= slight chance of dozing 2= moderate chance of dozing 3= high chance of dozing  Sitting and reading: 1 Watching TV: 1 Sitting inactive in a public place (ex. Theater or meeting): 1 As a passenger in a car for an hour without a break: 0 Lying down to rest in the afternoon: 1 Sitting and talking to someone: 0 Sitting quietly after lunch (no alcohol): 1 In a car, while stopped in traffic: 0 Total: 5     Objective:  Neurological Exam  Physical Exam Physical Examination:   Vitals:   02/04/18 1337  BP: (!) 147/89  Pulse: 78    General  Examination: The patient is a very pleasant 57 y.o. female in no acute distress. She appears well-developed and well-nourished and well groomed.   HEENT: Normocephalic, atraumatic, pupils are equal, round and reactive to light and accommodation.  is normal with sharp disc margins noted. Extraocular tracking is good without limitation to gaze excursion or nystagmus noted. Normal smooth pursuit is noted. Hearing is grossly intact. Face is symmetric with normal facial animation and normal facial sensation. Speech is clear with no dysarthria noted. There is no hypophonia. There is no lip, neck/head, jaw or voice tremor. Neck is supple with full range of passive and active motion. There are no carotid bruits on auscultation. Oropharynx exam  reveals: mild mouth dryness, adequate dental hygiene and moderate airway crowding, due to smaller airway entry, tonsils are small, 1+ bilaterally, uvula is also small. Mallampati is class II. Tongue protrudes centrally and palate elevates symmetrically. She has a shorter appearing neck. Unremarkable scar from thyroidectomy. Neck circumference is 15-3/8 inches. She has a mild overbite.  Chest: Clear to auscultation without wheezing, rhonchi or crackles noted.  Heart: S1+S2+0, regular and normal without murmurs, rubs or gallops noted.   Abdomen: Soft, non-tender and non-distended with normal bowel sounds appreciated on auscultation.  Extremities: There is no pitting edema in the distal lower extremities bilaterally.   Skin: Warm and dry without trophic changes noted.  Musculoskeletal: exam reveals no obvious joint deformities, tenderness or joint swelling or erythema.   Neurologically:  Mental status: The patient is awake, alert and oriented in all 4 spheres. Her immediate and remote memory, attention, language skills and fund of knowledge are appropriate. There is no evidence of aphasia, agnosia, apraxia or anomia. Speech is clear with normal prosody and enunciation.  Thought process is linear. Mood is normal and affect is normal.  Cranial nerves II - XII are as described above under HEENT exam. In addition: shoulder shrug is normal with equal shoulder height noted. Motor exam: Normal bulk, strength and tone is noted. There is no drift, tremor or rebound. Romberg is negative. Reflexes are 1+ throughout. Fine motor skills and coordination: grossly intact.  Cerebellar testing: No dysmetria or intention tremor. There is no truncal or gait ataxia.  Sensory exam: intact to light touch in the upper and lower extremities.  Gait, station and balance: She stands easily. No veering to one side is noted. No leaning to one side is noted. Posture is age-appropriate and stance is narrow based. Gait shows normal stride length and normal pace. No problems turning are noted. Tandem walk is unremarkable.   Assessment and Plan:   In summary, Raedyn Jakubiak is a very pleasant 57 y.o.-year old female with an underlying medical history of Graves' disease status post thyroidectomy in 03/2015 with postoperative hypothyroidism, vitamin D deficiency, Hx of pericardial effusion, history of pneumonia, and obesity, whose history and physical exam are concerning for obstructive sleep apnea (OSA). I had a long chat with the patient about my findings and the diagnosis of OSA, its prognosis and treatment options. We talked about medical treatments, surgical interventions and non-pharmacological approaches. I explained in particular the risks and ramifications of untreated moderate to severe OSA, especially with respect to developing cardiovascular disease down the Road, including congestive heart failure, difficult to treat hypertension, cardiac arrhythmias, or stroke. Even type 2 diabetes has, in part, been linked to untreated OSA. Symptoms of untreated OSA include daytime sleepiness, memory problems, mood irritability and mood disorder such as depression and anxiety, lack of energy, as well as  recurrent headaches, especially morning headaches. We talked about trying to maintain a healthy lifestyle in general, as well as the importance of weight control. I encouraged the patient to eat healthy, exercise daily and keep well hydrated, to keep a scheduled bedtime and wake time routine, to not skip any meals and eat healthy snacks in between meals. I advised the patient not to drive when feeling sleepy. I recommended the following at this time: sleep study with potential positive airway pressure titration. (We will score hypopneas at 4%).   I explained the sleep test procedure to the patient and also outlined possible surgical and non-surgical treatment options of OSA, including the use of a  custom-made dental device (which would require a referral to a specialist dentist or oral surgeon), upper airway surgical options, such as pillar implants, radiofrequency surgery, tongue base surgery, and UPPP (which would involve a referral to an ENT surgeon). Rarely, jaw surgery such as mandibular advancement may be considered.  I also explained the CPAP treatment option to the patient, who indicated that she would be willing to try CPAP if the need arises. I explained the importance of being compliant with PAP treatment, not only for insurance purposes but primarily to improve Her symptoms, and for the patient's long term health benefit, including to reduce Her cardiovascular risks. I answered all her questions today and the patient was in agreement. I plan to see her back after the sleep study is completed and encouraged her to call with any interim questions, concerns, problems or updates.   Thank you very much for allowing me to participate in the care of this nice patient. If I can be of any further assistance to you please do not hesitate to call me at [phone].  Sincerely,   Star Age, MD, PhD

## 2018-02-18 ENCOUNTER — Ambulatory Visit (INDEPENDENT_AMBULATORY_CARE_PROVIDER_SITE_OTHER): Payer: 59 | Admitting: Neurology

## 2018-02-18 DIAGNOSIS — R0683 Snoring: Secondary | ICD-10-CM

## 2018-02-18 DIAGNOSIS — G478 Other sleep disorders: Secondary | ICD-10-CM

## 2018-02-18 DIAGNOSIS — R0689 Other abnormalities of breathing: Secondary | ICD-10-CM

## 2018-02-18 DIAGNOSIS — Z820 Family history of epilepsy and other diseases of the nervous system: Secondary | ICD-10-CM

## 2018-02-18 DIAGNOSIS — G4733 Obstructive sleep apnea (adult) (pediatric): Secondary | ICD-10-CM | POA: Diagnosis not present

## 2018-02-18 DIAGNOSIS — Z6841 Body Mass Index (BMI) 40.0 and over, adult: Secondary | ICD-10-CM

## 2018-02-18 DIAGNOSIS — G4720 Circadian rhythm sleep disorder, unspecified type: Secondary | ICD-10-CM

## 2018-02-18 DIAGNOSIS — Z8679 Personal history of other diseases of the circulatory system: Secondary | ICD-10-CM

## 2018-02-23 NOTE — Procedures (Signed)
PATIENT'S NAME:  Carla Dixon, Carla Dixon DOB:      [birth year]      MR#:    [id number]     DATE OF RECORDING: 02/18/2018 REFERRING M.D.:  Glendale Chard, MD Study Performed:   Baseline Polysomnogram HISTORY: 57 year old woman with a history of Graves' disease status post thyroidectomy in 03/2015 with postoperative hypothyroidism, vitamin D deficiency, Hx of pericardial effusion, history of pneumonia, and obesity, who reports snoring and excessive daytime somnolence. The patient endorsed the Epworth Sleepiness Scale at 5/24 points. The patient's weight 203 pounds with a height of 58 (inches), resulting in a BMI of 41.4 kg/m2. The patient's neck circumference measured 15.5 inches.  CURRENT MEDICATIONS: Synthroid, Phentermine, Vitamin D.   PROCEDURE:  This is a multichannel digital polysomnogram utilizing the Somnostar 11.2 system.  Electrodes and sensors were applied and monitored per AASM Specifications.   EEG, EOG, Chin and Limb EMG, were sampled at 200 Hz.  ECG, Snore and Nasal Pressure, Thermal Airflow, Respiratory Effort, CPAP Flow and Pressure, Oximetry was sampled at 50 Hz. Digital video and audio were recorded.      BASELINE STUDY  Lights Out was at 22:37 and Lights On at 05:07.  Total recording time (TRT) was 391 minutes, with a total sleep time (TST) of  272 minutes.   The patient's sleep latency was 80.5 minutes, which is delayed. REM latency was 107 minutes. The sleep efficiency was 69.6%, which is reduced.     SLEEP ARCHITECTURE: WASO (Wake after sleep onset) was 72 minutes with mild to moderate sleep fragmentation noted. There were 15 minutes in Stage N1, 150 minutes Stage N2, 76.5 minutes Stage N3 and 30.5 minutes in Stage REM.  The percentage of Stage N1 was 5.5%, Stage N2 was 55.1%, Stage N3 was 28.1% and Stage R (REM sleep) was 11.2%, which is reduced. The arousals were noted as: 24 were spontaneous, 1 were associated with PLMs, 1 were associated with respiratory events.   RESPIRATORY ANALYSIS:   There were a total of 43 respiratory events:  28 obstructive apneas, 0 central apneas and 0 mixed apneas with a total of 28 apneas and an apnea index (AI) of 6.2 /hour. There were 15 hypopneas with a hypopnea index of 3.3 /hour. The patient also had 0 respiratory event related arousals (RERAs).      The total APNEA/HYPOPNEA INDEX (AHI) was 9.50./hour and the total RESPIRATORY DISTURBANCE INDEX was 0. 9.5 /hour.  39 events occurred in REM sleep and 0 events in NREM. The REM AHI was 76.7 /hour, versus a non-REM AHI of 1.. The patient spent 81 minutes of total sleep time in the supine position and 191 minutes in non-supine.. The supine AHI was 0/h versus a non-supine AHI of 13.5.  OXYGEN SATURATION & C02:  The Wake baseline 02 saturation was 96%, with the lowest being 78% (51% on technical report was error). Time spent below 89% saturation equaled 27 minutes.  PERIODIC LIMB MOVEMENTS: The patient had a total of 5 Periodic Limb Movements.  The Periodic Limb Movement (PLM) index was 1.1 and the PLM Arousal index was .2/hour.  Audio and video analysis did not show any abnormal or unusual movements, behaviors, phonations or vocalizations. The patient took no bathroom breaks. Moderate to loud snoring was noted. The EKG was in keeping with normal sinus rhythm (NSR).   Post-study, the patient indicated that sleep was worse than usual.   IMPRESSION:  1. Obstructive Sleep Apnea (OSA) 2. Dysfunctions associated with sleep stages or arousals from sleep  RECOMMENDATIONS:  1. This study demonstrates overall mild obstructive sleep apnea, severe in REM sleep with a total AHI of 9.5/hour, REM AHI of 76.6/hour, and O2 nadir of 78%. The absence of supine REM sleep may underestimate her sleep disordered breathing to some degree. Given the patient's medical history, significant REM-related desaturations, and her sleep related complaints, treatment with positive airway pressure is recommended. A full-night CPAP  titration study is recommended to optimize therapy. Other treatment options may include avoidance of supine sleep position along with weight loss, upper airway or jaw surgery in selected patients or the use of an oral appliance in certain patients. ENT evaluation and/or consultation with a maxillofacial surgeon or dentist may be feasible in some instances.    2. Please note that untreated obstructive sleep apnea may carry additional perioperative morbidity. Patients with significant obstructive sleep apnea should receive perioperative PAP therapy and the surgeons and particularly the anesthesiologist should be informed of the diagnosis and the severity of the sleep disordered breathing. 3. This study shows sleep fragmentation and abnormal sleep stage percentages; these are nonspecific findings and per se do not signify an intrinsic sleep disorder or a cause for the patient's sleep-related symptoms. Causes include (but are not limited to) the first night effect of the sleep study, circadian rhythm disturbances, medication effect or an underlying mood disorder or medical problem.  4. The patient should be cautioned not to drive, work at heights, or operate dangerous or heavy equipment when tired or sleepy. Review and reiteration of good sleep hygiene measures should be pursued with any patient. 5. The patient will be seen in follow-up in the sleep clinic at [facility] for discussion of the test results, symptom and treatment compliance review, further management strategies, etc. The referring provider will be notified of the test results.  I certify that I have reviewed the entire raw data recording prior to the issuance of this report in accordance with the Standards of Accreditation of the American Academy of Sleep Medicine (AASM)  Star Age, MD, PhD Diplomat, American Board of Neurology and Sleep Medicine (Neurology and Sleep Medicine)

## 2018-02-23 NOTE — Progress Notes (Signed)
Patient referred by Dr. Glendale Chard, seen by me on 02/04/18, diagnostic PSG on 02/18/18.   Please call and notify the patient that the recent sleep study showed overall mild obstructive sleep apnea, severe in REM sleep with a total AHI of 9.5/hour, REM AHI of 76.6/hour, and O2 nadir of 78%. The absence of supine REM sleep may underestimate her sleep disordered breathing to some degree. Given the patient's medical history, significant REM-related desaturations, and her sleep related complaints, I recommend treatment for this in the form of CPAP. This will require a repeat sleep study for proper titration and mask fitting and correct monitoring of the oxygen saturations. Please explain to patient. I have placed an order in the chart. Thanks.  Star Age, MD, PhD [facility])

## 2018-02-23 NOTE — Addendum Note (Signed)
Addended by: Star Age on: 02/23/2018 08:18 AM   Modules accepted: Orders

## 2018-02-24 ENCOUNTER — Telehealth: Payer: Self-pay | Admitting: Neurology

## 2018-02-24 NOTE — Telephone Encounter (Signed)
-----   Message from Star Age, MD sent at 02/23/2018  8:18 AM EDT ----- Patient referred by Dr. Glendale Chard, seen by me on 02/04/18, diagnostic PSG on 02/18/18.   Please call and notify the patient that the recent sleep study showed overall mild obstructive sleep apnea, severe in REM sleep with a total AHI of 9.5/hour, REM AHI of 76.6/hour, and O2 nadir of 78%. The absence of supine REM sleep may underestimate her sleep disordered breathing to some degree. Given the patient's medical history, significant REM-related desaturations, and her sleep related complaints, I recommend treatment for this in the form of CPAP. This will require a repeat sleep study for proper titration and mask fitting and correct monitoring of the oxygen saturations. Please explain to patient. I have placed an order in the chart. Thanks.  Star Age, MD, PhD [facility])

## 2018-02-24 NOTE — Telephone Encounter (Signed)
I called Carla Dixon. I advised Carla Dixon that Dr. Rexene Alberts reviewed their sleep study results and found that has obstructive sleep apnea and recommends that Carla Dixon be treated with a cpap. Dr. Rexene Alberts recommends that Carla Dixon return for a repeat sleep study in order to properly titrate the cpap and ensure a good mask fit. Carla Dixon is agreeable to returning for a titration study. I advised Carla Dixon that our sleep lab will file with Carla Dixon's insurance and call Carla Dixon to schedule the sleep study when we hear back from the Carla Dixon's insurance regarding coverage of this sleep study. I reviewed the other treatment options with the patient as well that was mentioned by Dr Rexene Alberts. Carla Dixon has an apt today with her dentist and will get more information on the dental device while she is there.  Carla Dixon verbalized understanding of results. Carla Dixon had no questions at this time but was encouraged to call back if questions arise.Carla Dixon will look forward to the call from the sleep lab to get scheduled with CPAP titration and will inform us if she chooses not to go that route.

## 2018-03-03 ENCOUNTER — Ambulatory Visit (INDEPENDENT_AMBULATORY_CARE_PROVIDER_SITE_OTHER): Payer: 59 | Admitting: Neurology

## 2018-03-03 DIAGNOSIS — G4720 Circadian rhythm sleep disorder, unspecified type: Secondary | ICD-10-CM

## 2018-03-03 DIAGNOSIS — G4733 Obstructive sleep apnea (adult) (pediatric): Secondary | ICD-10-CM | POA: Diagnosis not present

## 2018-03-03 DIAGNOSIS — R0689 Other abnormalities of breathing: Secondary | ICD-10-CM

## 2018-03-03 DIAGNOSIS — Z8679 Personal history of other diseases of the circulatory system: Secondary | ICD-10-CM

## 2018-03-03 DIAGNOSIS — Z820 Family history of epilepsy and other diseases of the nervous system: Secondary | ICD-10-CM

## 2018-03-03 DIAGNOSIS — G478 Other sleep disorders: Secondary | ICD-10-CM

## 2018-03-03 DIAGNOSIS — Z6841 Body Mass Index (BMI) 40.0 and over, adult: Secondary | ICD-10-CM

## 2018-03-04 ENCOUNTER — Telehealth: Payer: Self-pay

## 2018-03-04 NOTE — Telephone Encounter (Signed)
I called pt. I advised pt that Dr. Rexene Alberts reviewed their sleep study results and found that pt pt did well with the cpap during the latest sleep study. Dr. Rexene Alberts recommends that pt start a cpap at home. I reviewed PAP compliance expectations with the pt. Pt is agreeable to starting a CPAP. I advised pt that an order will be sent to a DME, [facility], and [facility] will call the pt within about one week after they file with the pt's insurance. [facility] will show the pt how to use the machine, fit for masks, and troubleshoot the CPAP if needed. Pt declined to schedule a follow up with Dr. Rexene Alberts at this time because she did not have access to her calendar. I asked pt to call us back as soon as possible and schedule an appt in November with Dr. Rexene Alberts and stressed the importance of this appt due to insurance requiring a visit 31-90 days post cpap start. A letter with all of this information in it will be sent to the pt's mychart account as a reminder. I verified with the pt that the address we have on file is correct. Pt verbalized understanding of results. Pt had no questions at this time but was encouraged to call back if questions arise.

## 2018-03-04 NOTE — Addendum Note (Signed)
Addended by: Star Age on: 03/04/2018 08:37 AM   Modules accepted: Orders

## 2018-03-04 NOTE — Progress Notes (Signed)
Patient referred by Dr. Glendale Chard, seen by me on 02/04/18, diagnostic PSG on 02/18/18. Patient had a CPAP titration study on 03/03/18. Please call and inform patient that I have entered an order for treatment with positive airway pressure (PAP) treatment for obstructive sleep apnea (OSA). She did well during the latest sleep study with CPAP. We will, therefore, arrange for a machine for home use through a DME (durable medical equipment) company of Her choice; and I will see the patient back in follow-up in about 10 weeks. Please also explain to the patient that I will be looking out for compliance data, which can be downloaded from the machine (stored on an SD card, that is inserted in the machine) or via remote access through a modem, that is built into the machine. At the time of the followup appointment we will discuss sleep study results and how it is going with PAP treatment at home. Please advise patient to bring Her machine at the time of the first FU visit, even though this is cumbersome. Bringing the machine for every visit after that will likely not be needed, but often helps for the first visit to troubleshoot if needed. Please re-enforce the importance of compliance with treatment and the need for [place] to monitor compliance data - often an insurance requirement and actually good feedback for the patient as far as how they are doing.  Also remind patient, that any interim PAP machine or mask issues should be first addressed with the DME company, as they can often help better with technical and mask fit issues. Please ask if patient has a preference regarding DME company.  Please also make sure, the patient has a follow-up appointment with me in about 10 weeks from the setup date, thanks. May see one of our nurse practitioners if needed for proper timing of the FU appointment.  Please fax or rout report to the referring provider. Thanks,   Star Age, MD, PhD [facility])

## 2018-03-04 NOTE — Procedures (Signed)
PATIENT'S NAME:  Carla Dixon, Carla Dixon DOB:      1961/01/08      MR#:    [id number]     DATE OF RECORDING: 03/03/2018 REFERRING M.D.:  Glendale Chard, MD Study Performed:   CPAP  Titration HISTORY: 57 year old woman with a history of Graves' disease status post thyroidectomy in 03/2015 with postoperative hypothyroidism, vitamin D deficiency, Hx of pericardial effusion, history of pneumonia, and obesity, who returns for CPAP titration study. Her baseline PSG from 02/18/2018 showed a total AHI of 9.5/h and low spo2 of 78%. The patient endorsed the Epworth Sleepiness Scale at 5 points. The patient's weight 203 pounds with a height of 58 (inches), resulting in a BMI of 42.6 kg/m2. The patient's neck circumference measured 15 inches.  CURRENT MEDICATIONS: Synthroid, Phentermine, Vitamin D.  PROCEDURE:  This is a multichannel digital polysomnogram utilizing the SomnoStar 11.2 system.  Electrodes and sensors were applied and monitored per AASM Specifications.   EEG, EOG, Chin and Limb EMG, were sampled at 200 Hz.  ECG, Snore and Nasal Pressure, Thermal Airflow, Respiratory Effort, CPAP Flow and Pressure, Oximetry was sampled at 50 Hz. Digital video and audio were recorded.      The patient was fitted with a small Respironics Dreamwear mask. CPAP was initiated at 5 cmH20 with heated humidity per AASM standards and pressure was advanced to 9 cmH20 because of hypopneas, apneas and desaturations. At a PAP pressure of 9 cmH20, there was a reduction of the AHI to 0/hour with non-supine REM sleep achieved and O2 nadir of 91%.   Lights Out was at 22:59 and Lights On at 05:00. Total recording time (TRT) was 361 minutes, with a total sleep time (TST) of 165.5 minutes. The patient's sleep latency to persistent sleep was 90 minutes. REM latency was 120 minutes.  The sleep efficiency was 45.8%, which is markedly reduced.    SLEEP ARCHITECTURE: WASO (Wake after sleep onset)  was 165 minutes with several longer periods of wakefulness.  There were 18 minutes in Stage N1, 52.5 minutes Stage N2, 70 minutes Stage N3 and 25 minutes in Stage REM.  The percentage of Stage N1 was 10.9%, which is increased, Stage N2 was 31.7%, which is reduced, Stage N3 was 42.3%, which is increased, and Stage R (REM sleep) was 15.1%, which is mildly reduced. The arousals were noted as: 24 were spontaneous, 0 were associated with PLMs, 0 were associated with respiratory events.  RESPIRATORY ANALYSIS:  There was a total of 0 respiratory events: 0 obstructive apneas, 0 central apneas and 0 mixed apneas with a total of 0 apneas and an apnea index (AI) of 0 /hour. There were 0 hypopneas with a hypopnea index of 0/hour. The patient also had 0 respiratory event related arousals (RERAs).      The total APNEA/HYPOPNEA INDEX  (AHI) was 0. /hour and the total RESPIRATORY DISTURBANCE INDEX was 0 0./hour  0 events occurred in REM sleep and 0 events in NREM. The REM AHI was 0 /hour versus a non-REM AHI of 0 0./hour.  The patient spent 0 minutes of total sleep time in the supine position and 166 minutes in non-supine. The supine AHI was 0.0, versus a non-supine AHI of 0.0.  OXYGEN SATURATION & C02:  The baseline 02 saturation was 96%, with the lowest being 88%. Time spent below 89% saturation equaled 0 minutes.  PERIODIC LIMB MOVEMENTS:  The patient had a total of 0 Periodic Limb Movements. The Periodic Limb Movement (PLM) index was 0 and the  PLM Arousal index was 0 /hour.  Audio and video analysis did not show any abnormal or unusual movements, behaviors, phonations or vocalizations. The patient took no bathroom breaks. The EKG was in keeping with normal sinus rhythm (NSR).  Post-study, the patient indicated that sleep was worse than usual.   IMPRESSION:   1. Obstructive Sleep Apnea (OSA) 2. Dysfunctions associated with sleep stages or arousals from sleep    RECOMMENDATIONS:   1. This study demonstrates resolution of the patient's obstructive sleep apnea with CPAP  therapy. I will, therefore, start the patient on home CPAP treatment at a pressure of 9 cm via small Dreamwear mask with heated humidity. The patient should be reminded to be fully compliant with PAP therapy to improve sleep related symptoms and decrease long term cardiovascular risks. The patient should be reminded, that it may take up to 3 months to get fully used to using PAP with all planned sleep. The earlier full compliance is achieved, the better long term compliance tends to be. Please note that untreated obstructive sleep apnea may carry additional perioperative morbidity. Patients with significant obstructive sleep apnea should receive perioperative PAP therapy and the surgeons and particularly the anesthesiologist should be informed of the diagnosis and the severity of the sleep disordered breathing. 2. This study shows sleep fragmentation and abnormal sleep stage percentages; these are nonspecific findings and per se do not signify an intrinsic sleep disorder or a cause for the patient's sleep-related symptoms. Causes include (but are not limited to) the first night effect of the sleep study, circadian rhythm disturbances, medication effect or an underlying mood disorder or medical problem.  3. The patient should be cautioned not to drive, work at heights, or operate dangerous or heavy equipment when tired or sleepy. Review and reiteration of good sleep hygiene measures should be pursued with any patient. 4. The patient will be seen in follow-up in the sleep clinic at [facility] for discussion of the test results, symptom and treatment compliance review, further management strategies, etc. The referring provider will be notified of the test results.   I certify that I have reviewed the entire raw data recording prior to the issuance of this report in accordance with the Standards of Accreditation of the American Academy of Sleep Medicine (AASM)   Star Age, MD, PhD Diplomat, American Board of  Neurology and Sleep Medicine (Neurology and Sleep Medicine)

## 2018-03-04 NOTE — Telephone Encounter (Signed)
-----   Message from Star Age, MD sent at 03/04/2018  8:36 AM EDT ----- Patient referred by Dr. Glendale Chard, seen by me on 02/04/18, diagnostic PSG on 02/18/18. Patient had a CPAP titration study on 03/03/18. Please call and inform patient that I have entered an order for treatment with positive airway pressure (PAP) treatment for obstructive sleep apnea (OSA). She did well during the latest sleep study with CPAP. We will, therefore, arrange for a machine for home use through a DME (durable medical equipment) company of Her choice; and I will see the patient back in follow-up in about 10 weeks. Please also explain to the patient that I will be looking out for compliance data, which can be downloaded from the machine (stored on an SD card, that is inserted in the machine) or via remote access through a modem, that is built into the machine. At the time of the followup appointment we will discuss sleep study results and how it is going with PAP treatment at home. Please advise patient to bring Her machine at the time of the first FU visit, even though this is cumbersome. Bringing the machine for every visit after that will likely not be needed, but often helps for the first visit to troubleshoot if needed. Please re-enforce the importance of compliance with treatment and the need for [place] to monitor compliance data - often an insurance requirement and actually good feedback for the patient as far as how they are doing.  Also remind patient, that any interim PAP machine or mask issues should be first addressed with the DME company, as they can often help better with technical and mask fit issues. Please ask if patient has a preference regarding DME company.  Please also make sure, the patient has a follow-up appointment with me in about 10 weeks from the setup date, thanks. May see one of our nurse practitioners if needed for proper timing of the FU appointment.  Please fax or rout report to the referring provider.  Thanks,   Star Age, MD, PhD [facility])

## 2018-03-16 DIAGNOSIS — Z6841 Body Mass Index (BMI) 40.0 and over, adult: Secondary | ICD-10-CM | POA: Insufficient documentation

## 2018-03-16 LAB — BASIC METABOLIC PANEL
BUN: 9 (ref 4–21)
Creatinine: 0.7 (ref 0.5–1.1)
Glucose: 85
Potassium: 4.5 (ref 3.4–5.3)
Sodium: 140 (ref 137–147)

## 2018-03-16 LAB — HEMOGLOBIN A1C: HEMOGLOBIN A1C: 6

## 2018-03-16 LAB — TSH: TSH: 1.91 (ref 0.41–5.90)

## 2018-04-21 ENCOUNTER — Other Ambulatory Visit: Payer: Self-pay | Admitting: Internal Medicine

## 2018-04-22 ENCOUNTER — Telehealth: Payer: Self-pay

## 2018-04-22 ENCOUNTER — Other Ambulatory Visit: Payer: Self-pay

## 2018-04-22 MED ORDER — PHENTERMINE HCL 15 MG PO CAPS: 15.0000 mg | ORAL_CAPSULE | ORAL | 1 refills | Status: DC

## 2018-04-22 NOTE — Telephone Encounter (Signed)
Patient notified phentermine prescription is read for pickup.

## 2018-05-03 ENCOUNTER — Encounter: Payer: Self-pay | Admitting: Internal Medicine

## 2018-05-03 DIAGNOSIS — Z6841 Body Mass Index (BMI) 40.0 and over, adult: Secondary | ICD-10-CM

## 2018-05-07 ENCOUNTER — Other Ambulatory Visit: Payer: Self-pay

## 2018-05-07 ENCOUNTER — Other Ambulatory Visit: Payer: Self-pay | Admitting: Internal Medicine

## 2018-05-07 MED ORDER — LEVOTHYROXINE SODIUM 125 MCG PO TABS: 125.0000 ug | ORAL_TABLET | Freq: Every day | ORAL | 1 refills | Status: DC

## 2018-05-07 MED ORDER — PHENTERMINE HCL 37.5 MG PO TABS: ORAL_TABLET | ORAL | 0 refills | Status: DC

## 2018-05-11 ENCOUNTER — Ambulatory Visit (INDEPENDENT_AMBULATORY_CARE_PROVIDER_SITE_OTHER): Payer: 59 | Admitting: Internal Medicine

## 2018-05-11 ENCOUNTER — Encounter: Payer: Self-pay | Admitting: Internal Medicine

## 2018-05-11 VITALS — BP 110/74 | HR 84 | Temp 97.7°F | Ht 58.5 in | Wt 201.6 lb

## 2018-05-11 DIAGNOSIS — Z23 Encounter for immunization: Secondary | ICD-10-CM

## 2018-05-11 DIAGNOSIS — E039 Hypothyroidism, unspecified: Secondary | ICD-10-CM | POA: Diagnosis not present

## 2018-05-11 DIAGNOSIS — Z6841 Body Mass Index (BMI) 40.0 and over, adult: Secondary | ICD-10-CM | POA: Diagnosis not present

## 2018-05-11 DIAGNOSIS — R7309 Other abnormal glucose: Secondary | ICD-10-CM | POA: Diagnosis not present

## 2018-05-11 MED ORDER — PHENTERMINE-TOPIRAMATE ER 7.5-46 MG PO CP24: 7.5000 mg | ORAL_CAPSULE | Freq: Every morning | ORAL | 1 refills | Status: DC

## 2018-05-11 NOTE — Patient Instructions (Signed)
Exercising to Lose Weight Exercising can help you to lose weight. In order to lose weight through exercise, you need to do vigorous-intensity exercise. You can tell that you are exercising with vigorous intensity if you are breathing very hard and fast and cannot hold a conversation while exercising. Moderate-intensity exercise helps to maintain your current weight. You can tell that you are exercising at a moderate level if you have a higher heart rate and faster breathing, but you are still able to hold a conversation. How often should I exercise? Choose an activity that you enjoy and set realistic goals. Your health care provider can help you to make an activity plan that works for you. Exercise regularly as directed by your health care provider. This may include:  Doing resistance training twice each week, such as: ? Push-ups. ? Sit-ups. ? Lifting weights. ? Using resistance bands.  Doing a given intensity of exercise for a given amount of time. Choose from these options: ? 150 minutes of moderate-intensity exercise every week. ? 75 minutes of vigorous-intensity exercise every week. ? A mix of moderate-intensity and vigorous-intensity exercise every week.  Children, pregnant women, people who are out of shape, people who are overweight, and older adults may need to consult a health care provider for individual recommendations. If you have any sort of medical condition, be sure to consult your health care provider before starting a new exercise program. What are some activities that can help me to lose weight?  Walking at a rate of at least 4.5 miles an hour.  Jogging or running at a rate of 5 miles per hour.  Biking at a rate of at least 10 miles per hour.  Lap swimming.  Roller-skating or in-line skating.  Cross-country skiing.  Vigorous competitive sports, such as football, basketball, and soccer.  Jumping rope.  Aerobic dancing. How can I be more active in my day-to-day  activities?  Use the stairs instead of the elevator.  Take a walk during your lunch break.  If you drive, park your car farther away from work or school.  If you take public transportation, get off one stop early and walk the rest of the way.  Make all of your phone calls while standing up and walking around.  Get up, stretch, and walk around every 30 minutes throughout the day. What guidelines should I follow while exercising?  Do not exercise so much that you hurt yourself, feel dizzy, or get very short of breath.  Consult your health care provider prior to starting a new exercise program.  Wear comfortable clothes and shoes with good support.  Drink plenty of water while you exercise to prevent dehydration or heat stroke. Body water is lost during exercise and must be replaced.  Work out until you breathe faster and your heart beats faster. This information is not intended to replace advice given to you by your health care provider. Make sure you discuss any questions you have with your health care provider. Document Released: 08/10/2010 Document Revised: 12/14/2015 Document Reviewed: 12/09/2013 Elsevier Interactive Patient Education  2018 Elsevier Inc.  

## 2018-05-11 NOTE — Progress Notes (Signed)
Subjective:     Patient ID: Carla Dixon , female    DOB: 1961/02/04 , 57 y.o.   MRN: [id number]   Thyroid Problem  Presents for follow-up visit. Patient reports no constipation, depressed mood, diaphoresis or palpitations.     Past Medical History:  Diagnosis Date  . Graves' disease   . Hypothyroidism    S/P thyroidectomy  . Pericardial effusion   . Pneumonia "several times"  . Pulmonary aspiration of fluid (HCC)       Current Outpatient Medications:  .  Cinnamon 500 MG capsule, Take 500 mg by mouth daily., Disp: , Rfl:  .  levothyroxine (SYNTHROID, LEVOTHROID) 125 MCG tablet, Take 1 tablet (125 mcg total) by mouth daily before breakfast., Disp: 90 tablet, Rfl: 1 .  Misc Natural Products (JOINT HEALTH) CAPS, Take by mouth., Disp: , Rfl:  .  Multiple Vitamin (MULTIVITAMIN) capsule, Take 1 capsule by mouth daily., Disp: , Rfl:  .  phentermine (ADIPEX-P) 37.5 MG tablet, TAKE 1 TABLET BY MOUTH EVERY DAY BEFORE BREAKFAST, Disp: 30 tablet, Rfl: 0 .  Phentermine-Topiramate (QSYMIA) 7.5-46 MG CP24, Take 7.5-46 mg by mouth every morning., Disp: 30 capsule, Rfl: 1 .  valACYclovir (VALTREX) 500 MG tablet, Take 500 mg by mouth 2 (two) times daily., Disp: , Rfl:  .  Vitamin D, Ergocalciferol, (DRISDOL) 50000 units CAPS capsule, Take 50,000 Units by mouth 2 (two) times a week., Disp: , Rfl: 0   Allergies  Allergen Reactions  . Neosporin Original [Bacitracin-Neomycin-Polymyxin] Rash     Review of Systems  Constitutional: Negative.  Negative for diaphoresis.  HENT: Negative.   Eyes: Negative.   Respiratory: Negative.   Cardiovascular: Negative.  Negative for palpitations.  Gastrointestinal: Negative.  Negative for constipation.  Skin: Negative.   Psychiatric/Behavioral: Negative.      Today's Vitals   05/11/18 1426  BP: 110/74  Pulse: 84  Temp: 97.7 F (36.5 C)  TempSrc: Oral  Weight: 201 lb 9.6 oz (91.4 kg)  Height: 4' 10.5" (1.486 m)   Body mass index is 41.42 kg/m.    Objective:  Physical Exam  Constitutional: She is oriented to person, place, and time. She appears well-developed and well-nourished.  Eyes: EOM are normal.  Neck: Normal range of motion.  Cardiovascular: Normal rate, regular rhythm and normal heart sounds.  Pulmonary/Chest: Effort normal and breath sounds normal.  Neurological: She is alert and oriented to person, place, and time.  Psychiatric: She has a normal mood and affect.  Nursing note and vitals reviewed.       Assessment And Plan:     1. Primary hypothyroidism  I WILL CHECK A THYROID PANEL AND ADJUST MEDS AS NEEDED. IMPORTANCE OF MEDICATION COMPLIANCE WAS DISCUSSED WITH THE PATIENT. SHE IS ENCOURAGED TO TAKE VITAMINS/SUPPLEMENTS WITH LUNCH/EVENING MEALS.   - TSH - T4, Free  2. Elevated glucose  HER A1C HAS BEEN ELEVATED IN THE PAST. I WILL CHECK AN A1C, BMET TODAY. SHE WAS ENCOURAGED TO AVOID SUGARY BEVERAGES AND PROCESSED FOODS INCLUDNG BREADS, RICE AND PASTA.  - Hemoglobin A1c - BMP8+EGFR  3. Need for immunization against influenza  - Flu vaccine HIGH DOSE PF (Fluzone High dose)  4. BMI 40.0-44.9, adult (HCC)  WE DISCUSSED TRANSITIONING HER FROM PHENTERMINE TO QSYMIA FOR WEIGHT MANAGEMENT. POSSIBLE SIDE EFFECTS OF QSYMIA WERE DISCUSSED WITH THE PATIENT IN DETAIL. RX WAS SENT TO THE PHARMACY. PT ADVISED PRIOR AUTH IS NEEDED FOR MOST INSURANCE COMPANIES. SHE IS OPEN TO TRYING OTHER MEDS IF THIS IS NOT COVERED.  SHE WILL RTO IN 8 WEEKS FOR RE-EVALUATION.   5. 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 SHE IS ENCOURAGED TO INCORPORATE MORE EXERCISE INTO HER DAILY ROUTINE.   [name], MD

## 2018-05-12 LAB — BMP8+EGFR
BUN / CREAT RATIO: 16 (ref 9–23)
BUN: 12 mg/dL (ref 6–24)
CO2: 24 mmol/L (ref 20–29)
CREATININE: 0.77 mg/dL (ref 0.57–1.00)
Calcium: 9.6 mg/dL (ref 8.7–10.2)
Chloride: 99 mmol/L (ref 96–106)
GFR calc Af Amer: 99 mL/min/{1.73_m2} (ref >59)
GFR, EST NON AFRICAN AMERICAN: 86 mL/min/{1.73_m2} (ref >59)
GLUCOSE: 84 mg/dL (ref 65–99)
Potassium: 4 mmol/L (ref 3.5–5.2)
SODIUM: 141 mmol/L (ref 134–144)

## 2018-05-12 LAB — HEMOGLOBIN A1C
Est. average glucose Bld gHb Est-mCnc: 123 mg/dL
Hgb A1c MFr Bld: 5.9 % — ABNORMAL HIGH (ref 4.8–5.6)

## 2018-05-12 LAB — T4, FREE: FREE T4: 2.24 ng/dL — AB (ref 0.82–1.77)

## 2018-05-12 LAB — TSH: TSH: 1.2 u[IU]/mL (ref 0.450–4.500)

## 2018-06-09 ENCOUNTER — Encounter: Payer: Self-pay | Admitting: Internal Medicine

## 2018-06-12 ENCOUNTER — Other Ambulatory Visit: Payer: Self-pay | Admitting: Internal Medicine

## 2018-06-12 NOTE — Telephone Encounter (Signed)
Phentermine 37.5 mg refill

## 2018-07-06 ENCOUNTER — Other Ambulatory Visit: Payer: Self-pay | Admitting: Internal Medicine

## 2018-07-07 ENCOUNTER — Encounter: Payer: Self-pay | Admitting: Internal Medicine

## 2018-07-09 ENCOUNTER — Ambulatory Visit: Payer: 59 | Admitting: Internal Medicine

## 2018-08-03 ENCOUNTER — Encounter: Payer: Self-pay | Admitting: Internal Medicine

## 2018-08-03 ENCOUNTER — Ambulatory Visit (INDEPENDENT_AMBULATORY_CARE_PROVIDER_SITE_OTHER): Payer: 59 | Admitting: Internal Medicine

## 2018-08-03 VITALS — BP 108/72 | HR 81 | Temp 98.1°F | Ht <= 58 in | Wt 201.8 lb

## 2018-08-03 DIAGNOSIS — Z1211 Encounter for screening for malignant neoplasm of colon: Secondary | ICD-10-CM | POA: Diagnosis not present

## 2018-08-03 DIAGNOSIS — Z0000 Encounter for general adult medical examination without abnormal findings: Secondary | ICD-10-CM

## 2018-08-03 DIAGNOSIS — Z86010 Personal history of colonic polyps: Secondary | ICD-10-CM

## 2018-08-03 LAB — POCT URINALYSIS DIPSTICK
BILIRUBIN UA: NEGATIVE
Glucose, UA: NEGATIVE
KETONES UA: NEGATIVE
Leukocytes, UA: NEGATIVE
Nitrite, UA: NEGATIVE
Protein, UA: NEGATIVE
Spec Grav, UA: 1.01 (ref 1.010–1.025)
Urobilinogen, UA: 0.2 E.U./dL
pH, UA: 6.5 (ref 5.0–8.0)

## 2018-08-03 MED ORDER — PHENTERMINE-TOPIRAMATE ER 7.5-46 MG PO CP24: 7.5000 mg | ORAL_CAPSULE | Freq: Every morning | ORAL | 1 refills | Status: DC

## 2018-08-03 NOTE — Progress Notes (Signed)
Subjective:     Patient ID: Carla Dixon , female    DOB: September 05, 1960 , 58 y.o.   MRN: [id number]   Chief Complaint  Patient presents with  . Annual Exam    HPI  She is here today for a full physical examination. She is followed by GYN for her pelvic examinations.  She has no specific concerns or complaints at this time.    Past Medical History:  Diagnosis Date  . Graves' disease   . Hypothyroidism    S/P thyroidectomy  . Pericardial effusion   . Pneumonia "several times"  . Pulmonary aspiration of fluid (HCC)      Family History  Problem Relation Age of Onset  . Hypertension Mother   . Diabetes Mother   . Gout Mother   . Heart disease Father   . Colon cancer Father      Current Outpatient Medications:  .  Cinnamon 500 MG capsule, Take 500 mg by mouth daily., Disp: , Rfl:  .  levothyroxine (SYNTHROID) 125 MCG tablet, Take 125 mcg by mouth daily before breakfast. Take 1 tablet by mouth Monday - Saturday and 1/2 tab on sunday, Disp: , Rfl:  .  Misc Natural Products (JOINT HEALTH) CAPS, Take by mouth., Disp: , Rfl:  .  Multiple Vitamin (MULTIVITAMIN) capsule, Take 1 capsule by mouth daily., Disp: , Rfl:  .  phentermine (ADIPEX-P) 37.5 MG tablet, TAKE 1 TABLET BY MOUTH EVERYDAY BEFORE BREAKFAST., Disp: 30 tablet, Rfl: 0 .  valACYclovir (VALTREX) 500 MG tablet, Take 500 mg by mouth 2 (two) times daily., Disp: , Rfl:  .  Vitamin D, Ergocalciferol, (DRISDOL) 1.25 MG (50000 UT) CAPS capsule, TAKE ONE CAPSULE ON TUESDAYS AND FRIDAYS EACH WEEK, Disp: 24 capsule, Rfl: 0 .  Phentermine-Topiramate (QSYMIA) 7.5-46 MG CP24, Take 7.5-46 mg by mouth every morning for 30 days., Disp: 30 capsule, Rfl: 1   Allergies  Allergen Reactions  . Neosporin Original [Bacitracin-Neomycin-Polymyxin] Rash      No LMP recorded (lmp unknown). Patient is postmenopausal..  Negative for: breast discharge, breast lump(s), breast pain and breast self exam. Associated symptoms include abnormal vaginal  bleeding. Pertinent negatives include abnormal bleeding (hematology), anxiety, decreased libido, depression, difficulty falling sleep, dyspareunia, history of infertility, nocturia, sexual dysfunction, sleep disturbances, urinary incontinence, urinary urgency, vaginal discharge and vaginal itching. Diet regular.The patient states her exercise level is  minimal.  . The patient's tobacco use is:  Social History   Tobacco Use  Smoking Status Never Smoker  Smokeless Tobacco Never Used  . She has been exposed to passive smoke. The patient's alcohol use is:  Social History   Substance and Sexual Activity  Alcohol Use No   Review of Systems  Constitutional: Negative.   HENT: Negative.   Eyes: Negative.   Respiratory: Negative.   Cardiovascular: Negative.   Gastrointestinal: Negative.   Endocrine: Negative.   Genitourinary: Negative.   Allergic/Immunologic: Negative.   Neurological: Negative.   Hematological: Negative.   Psychiatric/Behavioral: Negative.      Today's Vitals   08/03/18 1111  BP: 108/72  Pulse: 81  Temp: 98.1 F (36.7 C)  TempSrc: Oral  SpO2: 97%  Weight: 201 lb 12.8 oz (91.5 kg)  Height: 4' 10"  (1.473 m)   Body mass index is 42.18 kg/m.   Objective:  Physical Exam Vitals signs and nursing note reviewed.  Constitutional:      Appearance: Normal appearance. She is obese.  HENT:     Head: Normocephalic and atraumatic.  Right Ear: Tympanic membrane, ear canal and external ear normal.     Left Ear: Tympanic membrane, ear canal and external ear normal.     Nose: Nose normal.     Mouth/Throat:     Mouth: Mucous membranes are moist.     Pharynx: Oropharynx is clear.  Eyes:     Extraocular Movements: Extraocular movements intact.     Conjunctiva/sclera: Conjunctivae normal.     Pupils: Pupils are equal, round, and reactive to light.  Neck:     Musculoskeletal: Normal range of motion and neck supple.  Cardiovascular:     Rate and Rhythm: Normal rate and  regular rhythm.     Pulses: Normal pulses.     Heart sounds: Normal heart sounds.  Pulmonary:     Effort: Pulmonary effort is normal.     Breath sounds: Normal breath sounds.  Chest:     Breasts:        Right: Normal. No swelling, bleeding, inverted nipple, mass, nipple discharge or skin change.        Left: Normal. No swelling, bleeding, inverted nipple, mass, nipple discharge or skin change.  Abdominal:     General: Bowel sounds are normal.     Palpations: Abdomen is soft.     Comments: Protuberant, nontender.   Genitourinary:    Comments: deferred Musculoskeletal: Normal range of motion.  Skin:    General: Skin is warm and dry.  Neurological:     General: No focal deficit present.     Mental Status: She is alert.  Psychiatric:        Mood and Affect: Mood normal.         Assessment And Plan:     1. Routine general medical examination at health care facility  A full exam was performed. Importance of monthly self breast exams was discussed with the patient. PATIENT HAS BEEN ADVISED TO GET 30-45 MINUTES REGULAR EXERCISE NO LESS THAN FOUR TO FIVE DAYS PER WEEK - BOTH WEIGHTBEARING EXERCISES AND AEROBIC ARE RECOMMENDED.  SHE IS ADVISED TO FOLLOW A HEALTHY DIET WITH AT LEAST SIX FRUITS/VEGGIES PER DAY, DECREASE INTAKE OF RED MEAT, AND TO INCREASE FISH INTAKE TO TWO DAYS PER WEEK.  MEATS/FISH SHOULD NOT BE FRIED, BAKED OR BROILED IS PREFERABLE.  I SUGGEST WEARING SPF 50 SUNSCREEN ON EXPOSED PARTS AND ESPECIALLY WHEN IN THE DIRECT SUNLIGHT FOR AN EXTENDED PERIOD OF TIME.  PLEASE AVOID FAST FOOD RESTAURANTS AND INCREASE YOUR WATER INTAKE.  - Hepatitis C antibody - CMP14+EGFR - CBC - Lipid panel - Hemoglobin A1c - TSH - T4, Free - HIV antibody (with reflex) - POCT Urinalysis Dipstick (81002)  2. Screen for colon cancer  - Ambulatory referral to Gastroenterology  3. Personal history of colonic polyps  She reports she has gotten colonoscopies every five years in the past due  to h/o polyps. I will refer her to GI for CRC screening.   - Ambulatory referral to Gastroenterology        [name], MD

## 2018-08-03 NOTE — Patient Instructions (Signed)

## 2018-08-04 ENCOUNTER — Other Ambulatory Visit: Payer: Self-pay | Admitting: Internal Medicine

## 2018-08-04 LAB — CMP14+EGFR
ALT: 23 [IU]/L (ref 0–32)
AST: 18 [IU]/L (ref 0–40)
Albumin/Globulin Ratio: 1.6 (ref 1.2–2.2)
Albumin: 4.6 g/dL (ref 3.5–5.5)
Alkaline Phosphatase: 77 [IU]/L (ref 39–117)
BUN/Creatinine Ratio: 10 (ref 9–23)
BUN: 8 mg/dL (ref 6–24)
Bilirubin Total: 0.3 mg/dL (ref 0.0–1.2)
CO2: 25 mmol/L (ref 20–29)
Calcium: 9.4 mg/dL (ref 8.7–10.2)
Chloride: 97 mmol/L (ref 96–106)
Creatinine, Ser: 0.81 mg/dL (ref 0.57–1.00)
GFR calc Af Amer: 93 mL/min/{1.73_m2}
GFR calc non Af Amer: 81 mL/min/{1.73_m2}
Globulin, Total: 2.8 g/dL (ref 1.5–4.5)
Glucose: 101 mg/dL — ABNORMAL HIGH (ref 65–99)
Potassium: 4.4 mmol/L (ref 3.5–5.2)
Sodium: 140 mmol/L (ref 134–144)
Total Protein: 7.4 g/dL (ref 6.0–8.5)

## 2018-08-04 LAB — LIPID PANEL
Chol/HDL Ratio: 2.9 ratio (ref 0.0–4.4)
Cholesterol, Total: 179 mg/dL (ref 100–199)
HDL: 62 mg/dL (ref >39)
LDL Calculated: 109 mg/dL — ABNORMAL HIGH (ref 0–99)
Triglycerides: 41 mg/dL (ref 0–149)
VLDL Cholesterol Cal: 8 mg/dL (ref 5–40)

## 2018-08-04 LAB — CBC
Hematocrit: 41.1 % (ref 34.0–46.6)
Hemoglobin: 13.8 g/dL (ref 11.1–15.9)
MCH: 28.5 pg (ref 26.6–33.0)
MCHC: 33.6 g/dL (ref 31.5–35.7)
MCV: 85 fL (ref 79–97)
Platelets: 416 10*3/uL (ref 150–450)
RBC: 4.85 x10E6/uL (ref 3.77–5.28)
RDW: 14.9 % (ref 11.7–15.4)
WBC: 3.6 10*3/uL (ref 3.4–10.8)

## 2018-08-04 LAB — TSH: TSH: 2.81 u[IU]/mL (ref 0.450–4.500)

## 2018-08-04 LAB — T4, FREE: Free T4: 2.07 ng/dL — ABNORMAL HIGH (ref 0.82–1.77)

## 2018-08-04 LAB — HIV ANTIBODY (ROUTINE TESTING W REFLEX): HIV Screen 4th Generation wRfx: NONREACTIVE

## 2018-08-04 LAB — HEPATITIS C ANTIBODY: Hep C Virus Ab: <0.1 {s_co_ratio} (ref 0.0–0.9)

## 2018-08-04 LAB — HEMOGLOBIN A1C
Est. average glucose Bld gHb Est-mCnc: 123 mg/dL
Hgb A1c MFr Bld: 5.9 % — ABNORMAL HIGH (ref 4.8–5.6)

## 2018-08-04 NOTE — Progress Notes (Signed)
Here are your lab results:  You are negative for hepatitis c virus. Your liver and kidney function are normal. Your blood count is normal. Your LDL, bad cholesterol, is 109. Ideally, this should be less than 100.  As you incorporate more exercise into your daily routine, this will improve.   Your hba1c is 5.9, this is in prediabetes range. Your thyroid labs are both normal AND abnormal. Your TSH is within normal limits and your free t4 is elevated. I would like for you to skip Synthroid dose on Sundays. Have you been feeling more agitated or anxious?   You are prediabetic with an a1c 5.9, this is in prediabetes range. Lastly, you are HIV negative.   Please let me know if you have any questions.   Sincerely,    [name] N. [facility], MD

## 2018-09-21 ENCOUNTER — Other Ambulatory Visit: Payer: Self-pay | Admitting: Internal Medicine

## 2018-11-02 ENCOUNTER — Other Ambulatory Visit: Payer: Self-pay | Admitting: Internal Medicine

## 2019-01-21 ENCOUNTER — Other Ambulatory Visit: Payer: Self-pay | Admitting: Internal Medicine

## 2019-01-25 ENCOUNTER — Ambulatory Visit: Payer: 59 | Admitting: Internal Medicine

## 2019-01-25 ENCOUNTER — Encounter: Payer: Self-pay | Admitting: Internal Medicine

## 2019-01-25 ENCOUNTER — Other Ambulatory Visit: Payer: Self-pay

## 2019-01-25 VITALS — BP 108/66 | HR 75 | Temp 97.7°F | Ht <= 58 in | Wt 212.2 lb

## 2019-01-25 DIAGNOSIS — E039 Hypothyroidism, unspecified: Secondary | ICD-10-CM

## 2019-01-25 DIAGNOSIS — Z6841 Body Mass Index (BMI) 40.0 and over, adult: Secondary | ICD-10-CM

## 2019-01-25 DIAGNOSIS — R635 Abnormal weight gain: Secondary | ICD-10-CM

## 2019-01-25 NOTE — Patient Instructions (Signed)
Hypothyroidism  Hypothyroidism is when the thyroid gland does not make enough of certain hormones (it is underactive). The thyroid gland is a small gland located in the lower front part of the neck, just in front of the windpipe (trachea). This gland makes hormones that help control how the body uses food for energy (metabolism) as well as how the heart and brain function. These hormones also play a role in keeping your bones strong. When the thyroid is underactive, it produces too little of the hormones thyroxine (T4) and triiodothyronine (T3). What are the causes? This condition may be caused by:  Hashimoto's disease. This is a disease in which the body's disease-fighting system (immune system) attacks the thyroid gland. This is the most common cause.  Viral infections.  Pregnancy.  Certain medicines.  Birth defects.  Past radiation treatments to the head or neck for cancer.  Past treatment with radioactive iodine.  Past exposure to radiation in the environment.  Past surgical removal of part or all of the thyroid.  Problems with a gland in the center of the brain (pituitary gland).  Lack of enough iodine in the diet. What increases the risk? You are more likely to develop this condition if:  You are female.  You have a family history of thyroid conditions.  You use a medicine called lithium.  You take medicines that affect the immune system (immunosuppressants). What are the signs or symptoms? Symptoms of this condition include:  Feeling as though you have no energy (lethargy).  Not being able to tolerate cold.  Weight gain that is not explained by a change in diet or exercise habits.  Lack of appetite.  Dry skin.  Coarse hair.  Menstrual irregularity.  Slowing of thought processes.  Constipation.  Sadness or depression. How is this diagnosed? This condition may be diagnosed based on:  Your symptoms, your medical history, and a physical exam.  Blood  tests. You may also have imaging tests, such as an ultrasound or MRI. How is this treated? This condition is treated with medicine that replaces the thyroid hormones that your body does not make. After you begin treatment, it may take several weeks for symptoms to go away. Follow these instructions at home:  Take over-the-counter and prescription medicines only as told by your health care provider.  If you start taking any new medicines, tell your health care provider.  Keep all follow-up visits as told by your health care provider. This is important. ? As your condition improves, your dosage of thyroid hormone medicine may change. ? You will need to have blood tests regularly so that your health care provider can monitor your condition. Contact a health care provider if:  Your symptoms do not get better with treatment.  You are taking thyroid replacement medicine and you: ? Sweat a lot. ? Have tremors. ? Feel anxious. ? Lose weight rapidly. ? Cannot tolerate heat. ? Have emotional swings. ? Have diarrhea. ? Feel weak. Get help right away if you have:  Chest pain.  An irregular heartbeat.  A rapid heartbeat.  Difficulty breathing. Summary  Hypothyroidism is when the thyroid gland does not make enough of certain hormones (it is underactive).  When the thyroid is underactive, it produces too little of the hormones thyroxine (T4) and triiodothyronine (T3).  The most common cause is Hashimoto's disease, a disease in which the body's disease-fighting system (immune system) attacks the thyroid gland. The condition can also be caused by viral infections, medicine, pregnancy, or past   radiation treatment to the head or neck.  Symptoms may include weight gain, dry skin, constipation, feeling as though you do not have energy, and not being able to tolerate cold.  This condition is treated with medicine to replace the thyroid hormones that your body does not make. This information  is not intended to replace advice given to you by your health care provider. Make sure you discuss any questions you have with your health care provider. Document Released: 07/08/2005 Document Revised: 06/20/2017 Document Reviewed: 06/18/2017 Elsevier Patient Education  2020 [employer]  

## 2019-01-25 NOTE — Progress Notes (Signed)
Subjective:     Patient ID: Carla Dixon , female    DOB: [birth year] , 58 y.o.   MRN: [id number]   Chief Complaint  Patient presents with  . Hypothyroidism    HPI  Thyroid Problem Presents for follow-up visit. Patient reports no cold intolerance, constipation or palpitations. The symptoms have been stable.     Past Medical History:  Diagnosis Date  . Graves' disease   . Hypothyroidism    S/P thyroidectomy  . Pericardial effusion   . Pneumonia "several times"  . Pulmonary aspiration of fluid (HCC)      Family History  Problem Relation Age of Onset  . Hypertension Mother   . Diabetes Mother   . Gout Mother   . Heart disease Father   . Colon cancer Father   . Heart Problems Father      Current Outpatient Medications:  .  Cinnamon 500 MG capsule, Take 500 mg by mouth daily., Disp: , Rfl:  .  Misc Natural Products (JOINT HEALTH) CAPS, Take by mouth., Disp: , Rfl:  .  Multiple Vitamin (MULTIVITAMIN) capsule, Take 1 capsule by mouth daily., Disp: , Rfl:  .  Phentermine-Topiramate (QSYMIA) 7.5-46 MG CP24, Take 7.5-46 mg by mouth every morning for 30 days., Disp: 30 capsule, Rfl: 1 .  SYNTHROID 125 MCG tablet, TAKE 1 TABLET BY MOUTH DAILY BEFORE BREAKFAST., Disp: 90 tablet, Rfl: 1 .  valACYclovir (VALTREX) 500 MG tablet, Take 500 mg by mouth 2 (two) times daily., Disp: , Rfl:  .  Vitamin D, Ergocalciferol, (DRISDOL) 1.25 MG (50000 UT) CAPS capsule, TAKE ONE CAPSULE ON TUESDAYS AND FRIDAYS EACH WEEK, Disp: 24 capsule, Rfl: 0   Allergies  Allergen Reactions  . Neosporin Original [Bacitracin-Neomycin-Polymyxin] Rash     Review of Systems  Constitutional: Positive for unexpected weight change.       She has had some weight gain. Admits she is not moving as much as she should.   Respiratory: Negative.   Cardiovascular: Negative.  Negative for palpitations.  Gastrointestinal: Negative.  Negative for constipation.  Endocrine: Negative for cold intolerance.  Neurological:  Negative.   Psychiatric/Behavioral: Negative.      Today's Vitals   01/25/19 1528  BP: 108/66  Pulse: 75  Temp: 97.7 F (36.5 C)  TempSrc: Oral  Weight: 212 lb 3.2 oz (96.3 kg)  Height: 4\' 10"  (1.473 m)   Body mass index is 44.35 kg/m.   Objective:  Physical Exam Vitals signs and nursing note reviewed.  Constitutional:      Appearance: Normal appearance.  HENT:     Head: Normocephalic and atraumatic.  Cardiovascular:     Rate and Rhythm: Normal rate and regular rhythm.     Heart sounds: Normal heart sounds.  Pulmonary:     Effort: Pulmonary effort is normal.     Breath sounds: Normal breath sounds.  Skin:    General: Skin is warm.  Neurological:     General: No focal deficit present.     Mental Status: She is alert.  Psychiatric:        Mood and Affect: Mood normal.        Behavior: Behavior normal.         Assessment And Plan:     1. Primary hypothyroidism  I will check thyroid panel and adjust meds as needed.   2. Weight gain  Pt states she is not willing to take medications to address insulin resistance. She is encouraged to decrease her intake of refined  carbs and sugary beverages. I will make further recommendations once her labs are available for review.   - Insulin, random(561) - Hemoglobin A1c  3. BMI 40.0-44.9, adult ([facility])  Importance of achieving optimal weight to decrease risk of cardiovascular disease and cancers was discussed with the patient in full detail. She is encouraged to start slowly - start with 10 minutes twice daily at least three to four days per week and to gradually build to 30 minutes five days weekly. She was given tips to incorporate more activity into her daily routine - take stairs when possible, park farther away from grocery stores, etc.    [name], MD    THE PATIENT IS ENCOURAGED TO PRACTICE SOCIAL DISTANCING DUE TO THE COVID-19 PANDEMIC.

## 2019-01-26 LAB — HEMOGLOBIN A1C
Est. average glucose Bld gHb Est-mCnc: 126 mg/dL
Hgb A1c MFr Bld: 6 % — ABNORMAL HIGH (ref 4.8–5.6)

## 2019-01-26 LAB — INSULIN, RANDOM: INSULIN: 7.4 u[IU]/mL (ref 2.6–24.9)

## 2019-01-28 LAB — TSH: TSH: 4.32 u[IU]/mL (ref 0.450–4.500)

## 2019-01-28 LAB — SPECIMEN STATUS REPORT

## 2019-02-01 ENCOUNTER — Ambulatory Visit: Payer: 59 | Admitting: Internal Medicine

## 2019-03-10 LAB — HM MAMMOGRAPHY

## 2019-03-18 ENCOUNTER — Encounter: Payer: Self-pay | Admitting: Internal Medicine

## 2019-03-19 ENCOUNTER — Telehealth: Payer: Self-pay

## 2019-03-19 NOTE — Telephone Encounter (Signed)
Spoke with pt and Made an appt for weight loss and flu shot for pt

## 2019-03-22 ENCOUNTER — Ambulatory Visit: Payer: 59 | Admitting: Internal Medicine

## 2019-03-22 ENCOUNTER — Encounter: Payer: Self-pay | Admitting: Internal Medicine

## 2019-03-22 ENCOUNTER — Other Ambulatory Visit: Payer: Self-pay

## 2019-03-22 VITALS — BP 108/80 | HR 77 | Temp 98.1°F | Ht <= 58 in | Wt 215.6 lb

## 2019-03-22 DIAGNOSIS — Z6841 Body Mass Index (BMI) 40.0 and over, adult: Secondary | ICD-10-CM | POA: Diagnosis not present

## 2019-03-22 DIAGNOSIS — E039 Hypothyroidism, unspecified: Secondary | ICD-10-CM

## 2019-03-22 DIAGNOSIS — Z23 Encounter for immunization: Secondary | ICD-10-CM

## 2019-03-22 NOTE — Patient Instructions (Signed)

## 2019-03-22 NOTE — Progress Notes (Signed)
Subjective:     Patient ID: Carla Dixon , female    DOB: [birth year] , 58 y.o.   MRN: [id number]   Chief Complaint  Patient presents with  . Follow-up    weight    HPI  She is here today for f/u obesity. She admits that she has not been exercising on a regular basis.  She reports she has also been eating out a lot. She does not feel motivated to do better. She is hoping to resume phentermine to give her the boost she needs to help with her weight loss efforts.    Past Medical History:  Diagnosis Date  . Graves' disease   . Hypothyroidism    S/P thyroidectomy  . Pericardial effusion   . Pneumonia "several times"  . Pulmonary aspiration of fluid (HCC)      Family History  Problem Relation Age of Onset  . Hypertension Mother   . Diabetes Mother   . Gout Mother   . Heart disease Father   . Colon cancer Father   . Heart Problems Father      Current Outpatient Medications:  Marland Kitchen  Multiple Vitamin (MULTIVITAMIN) capsule, Take 1 capsule by mouth daily., Disp: , Rfl:  .  SYNTHROID 125 MCG tablet, TAKE 1 TABLET BY MOUTH DAILY BEFORE BREAKFAST., Disp: 90 tablet, Rfl: 1 .  valACYclovir (VALTREX) 500 MG tablet, Take 500 mg by mouth 2 (two) times daily., Disp: , Rfl:  .  Vitamin D, Ergocalciferol, (DRISDOL) 1.25 MG (50000 UT) CAPS capsule, TAKE ONE CAPSULE ON TUESDAYS AND FRIDAYS EACH WEEK, Disp: 24 capsule, Rfl: 0 .  Cinnamon 500 MG capsule, Take 500 mg by mouth daily., Disp: , Rfl:  .  Misc Natural Products (JOINT HEALTH) CAPS, Take by mouth., Disp: , Rfl:    Allergies  Allergen Reactions  . Neosporin Original [Bacitracin-Neomycin-Polymyxin] Rash     Review of Systems  Constitutional: Negative.   Respiratory: Negative.   Cardiovascular: Negative.   Gastrointestinal: Negative.   Neurological: Negative.   Psychiatric/Behavioral: Negative.      Today's Vitals   03/22/19 1159  BP: 108/80  Pulse: 77  Temp: 98.1 F (36.7 C)  TempSrc: Oral  SpO2: 96%  Weight: 215 lb 9.6  oz (97.8 kg)  Height: 4\' 10"  (1.473 m)   Body mass index is 45.06 kg/m.   Objective:  Physical Exam Vitals signs and nursing note reviewed.  Constitutional:      Appearance: Normal appearance.  HENT:     Head: Normocephalic and atraumatic.  Cardiovascular:     Rate and Rhythm: Normal rate and regular rhythm.     Heart sounds: Normal heart sounds.  Pulmonary:     Effort: Pulmonary effort is normal.     Breath sounds: Normal breath sounds.  Skin:    General: Skin is warm.  Neurological:     General: No focal deficit present.     Mental Status: She is alert.  Psychiatric:        Mood and Affect: Mood normal.        Behavior: Behavior normal.         Assessment And Plan:     1. Class 3 severe obesity due to excess calories with serious comorbidity and body mass index (BMI) of 45.0 to 49.9 in adult [facility])  Patient reminded that any rx medication for weight loss needs to be used in CONJUNCTION with diet/lifestyle changes including exercise. I will refer her [facility] for further assistance with her weight  loss efforts. She is advised there is psychologist on staff to help her determine why she lacks motivation. She is hesitant, because she does not think it will be helpful. Pt encouraged to at least go 3 visits to determine if this is a good fit.  Greater than 25 minutes of face to face time was spent in counseling and coordination of care. She is encouraged to perform chair exercises while watching TV.  She is also encouraged to increase her intake of low-glycemic fruits and veggies.   - Amb Ref to Medical Weight Management  2. Need for immunization against influenza  - Flu Vaccine QUAD 6+ mos PF IM (Fluarix Quad PF)  3. Primary hypothyroidism  Most recent labs from GYN reviewed. She will continue with current meds. She will rto in six weeks for a lab visit. All questions were answered to her satisfaction.  - TSH; Future - T4, Free; Future        [name], MD     THE PATIENT IS ENCOURAGED TO PRACTICE SOCIAL DISTANCING DUE TO THE COVID-19 PANDEMIC.

## 2019-04-22 LAB — HM COLONOSCOPY

## 2019-04-28 ENCOUNTER — Encounter: Payer: Self-pay | Admitting: Internal Medicine

## 2019-04-29 ENCOUNTER — Other Ambulatory Visit: Payer: Self-pay | Admitting: Internal Medicine

## 2019-05-03 ENCOUNTER — Other Ambulatory Visit: Payer: Self-pay | Admitting: Internal Medicine

## 2019-05-03 ENCOUNTER — Other Ambulatory Visit: Payer: 59

## 2019-05-03 ENCOUNTER — Other Ambulatory Visit: Payer: Self-pay

## 2019-05-04 ENCOUNTER — Other Ambulatory Visit: Payer: Self-pay | Admitting: Internal Medicine

## 2019-05-04 LAB — T4, FREE: Free T4: 2.19 ng/dL — ABNORMAL HIGH (ref 0.82–1.77)

## 2019-05-04 LAB — TSH: TSH: 0.186 u[IU]/mL — ABNORMAL LOW (ref 0.450–4.500)

## 2019-05-11 ENCOUNTER — Encounter: Payer: Self-pay | Admitting: Internal Medicine

## 2019-07-20 ENCOUNTER — Encounter: Payer: Self-pay | Admitting: Internal Medicine

## 2019-08-09 ENCOUNTER — Encounter: Payer: 59 | Admitting: Internal Medicine

## 2019-09-09 ENCOUNTER — Encounter: Payer: 59 | Admitting: Internal Medicine

## 2019-10-21 ENCOUNTER — Other Ambulatory Visit: Payer: Self-pay | Admitting: Internal Medicine

## 2019-11-16 ENCOUNTER — Ambulatory Visit: Payer: 59 | Admitting: Internal Medicine

## 2019-11-16 ENCOUNTER — Encounter: Payer: Self-pay | Admitting: Internal Medicine

## 2019-11-16 ENCOUNTER — Other Ambulatory Visit: Payer: Self-pay

## 2019-11-16 VITALS — BP 116/78 | HR 78 | Temp 97.7°F | Ht <= 58 in | Wt 218.0 lb

## 2019-11-16 DIAGNOSIS — Z0000 Encounter for general adult medical examination without abnormal findings: Secondary | ICD-10-CM | POA: Diagnosis not present

## 2019-11-16 DIAGNOSIS — Z6841 Body Mass Index (BMI) 40.0 and over, adult: Secondary | ICD-10-CM | POA: Diagnosis not present

## 2019-11-16 DIAGNOSIS — R209 Unspecified disturbances of skin sensation: Secondary | ICD-10-CM

## 2019-11-16 NOTE — Progress Notes (Addendum)
This visit occurred during the SARS-CoV-2 public health emergency.  Safety protocols were in place, including screening questions prior to the visit, additional usage of staff PPE, and extensive cleaning of exam room while observing appropriate contact time as indicated for disinfecting solutions.  Subjective:     Patient ID: Carla Dixon , female    DOB: 06-14-1961 , 59 y.o.   MRN: [id number]   Chief Complaint  Patient presents with  . Annual Exam    HPI  She is here today for a full physical exam. She is followed by Dr. Mancel Bale for her Gyn care. She was last seen in 2020. She has her mammograms performed here as well.     Past Medical History:  Diagnosis Date  . Graves' disease   . Hypothyroidism    S/P thyroidectomy  . Pericardial effusion   . Pneumonia "several times"  . Pulmonary aspiration of fluid (HCC)      Family History  Problem Relation Age of Onset  . Hypertension Mother   . Diabetes Mother   . Gout Mother   . Heart disease Father   . Colon cancer Father   . Heart Problems Father      Current Outpatient Medications:  .  SYNTHROID 125 MCG tablet, TAKE 1 TABLET BY MOUTH DAILY BEFORE BREAKFAST., Disp: 90 tablet, Rfl: 1 .  valACYclovir (VALTREX) 500 MG tablet, Take 500 mg by mouth 2 (two) times daily., Disp: , Rfl:    Allergies  Allergen Reactions  . Neosporin Original [Bacitracin-Neomycin-Polymyxin] Rash     The patient states she uses post menopausal status for birth control. Last LMP was No LMP recorded (lmp unknown). Patient is postmenopausal.. Negative for Dysmenorrhea Negative for: breast discharge, breast lump(s), breast pain and breast self exam. Associated symptoms include abnormal vaginal bleeding. Pertinent negatives include abnormal bleeding (hematology), anxiety, decreased libido, depression, difficulty falling sleep, dyspareunia, history of infertility, nocturia, sexual dysfunction, sleep disturbances, urinary incontinence, urinary urgency,  vaginal discharge and vaginal itching. Diet regular.The patient states her exercise level is  intermittent.  . The patient's tobacco use is:  Social History   Tobacco Use  Smoking Status Never Smoker  Smokeless Tobacco Never Used  . She has been exposed to passive smoke. The patient's alcohol use is:  Social History   Substance and Sexual Activity  Alcohol Use No  . Additional information: Last pap 03/10/2019, next one scheduled for 2023.   Review of Systems  Constitutional: Negative.   HENT: Negative.   Eyes: Negative.   Respiratory: Negative.   Cardiovascular: Negative.   Endocrine: Negative.   Genitourinary: Negative.   Musculoskeletal: Negative.   Skin: Negative.        She c/o scalp tenderness. Not sure what triggers her sx. Occurs on right side, behind her ear. Denies ever seeing a rash. She reports she has had similar sx in the past, previous workup in [place]. She denies visual changes. Has some discomfort when combing her hair. Denies having any issues with chewing.   Allergic/Immunologic: Negative.   Neurological: Negative.   Hematological: Negative.   Psychiatric/Behavioral: Negative.      Today's Vitals   11/16/19 1118  BP: 116/78  Pulse: 78  Temp: 97.7 F (36.5 C)  TempSrc: Oral  SpO2: 96%  Weight: 218 lb (98.9 kg)  Height: 4' 10"  (1.473 m)   Body mass index is 45.56 kg/m.   Objective:  Physical Exam Vitals and nursing note reviewed.  Constitutional:      Appearance: Normal appearance.  She is obese.  HENT:     Head: Normocephalic and atraumatic.     Right Ear: Tympanic membrane, ear canal and external ear normal.     Left Ear: Tympanic membrane, ear canal and external ear normal.     Nose:     Comments: Deferred, masked    Mouth/Throat:     Comments: Deferred, masked Eyes:     Extraocular Movements: Extraocular movements intact.     Conjunctiva/sclera: Conjunctivae normal.     Pupils: Pupils are equal, round, and reactive to light.   Cardiovascular:     Rate and Rhythm: Normal rate and regular rhythm.     Pulses: Normal pulses.     Heart sounds: Normal heart sounds.  Pulmonary:     Effort: Pulmonary effort is normal.     Breath sounds: Normal breath sounds.  Chest:     Breasts: Tanner Score is 5.        Right: Normal.        Left: Normal.  Abdominal:     General: Bowel sounds are normal.     Palpations: Abdomen is soft.     Comments: Rounded, soft. Difficult to assess organomegaly.   Genitourinary:    Comments: deferred Musculoskeletal:        General: Normal range of motion.     Cervical back: Normal range of motion and neck supple.  Skin:    General: Skin is warm and dry.  Neurological:     General: No focal deficit present.     Mental Status: She is alert and oriented to person, place, and time.  Psychiatric:        Mood and Affect: Mood normal.        Behavior: Behavior normal.         Assessment And Plan:     1. Routine general medical examination at health care facility  A full exam was performed.  Importance of monthly self breast exams was discussed with the patient. PATIENT IS ADVISED TO GET 30-45 MINUTES REGULAR EXERCISE NO LESS THAN FOUR TO FIVE DAYS PER WEEK - BOTH WEIGHTBEARING EXERCISES AND AEROBIC ARE RECOMMENDED.  SHE IS ADVISED TO FOLLOW A HEALTHY DIET WITH AT LEAST SIX FRUITS/VEGGIES PER DAY, DECREASE INTAKE OF RED MEAT, AND TO INCREASE FISH INTAKE TO TWO DAYS PER WEEK.  MEATS/FISH SHOULD NOT BE FRIED, BAKED OR BROILED IS PREFERABLE.  I SUGGEST WEARING SPF 50 SUNSCREEN ON EXPOSED PARTS AND ESPECIALLY WHEN IN THE DIRECT SUNLIGHT FOR AN EXTENDED PERIOD OF TIME.  PLEASE AVOID FAST FOOD RESTAURANTS AND INCREASE YOUR WATER INTAKE.  - CMP14+EGFR - CBC - Lipid panel - Hemoglobin A1c - TSH - T4, Free  2. Skin sensation disturbance  I will request her records from [place]. No abnormalities noted on exam. No rashes noted on exam.   3. Class 3 severe obesity due to excess calories with serious  comorbidity and body mass index (BMI) of 45.0 to 49.9 in adult (HCC)  BMI 45. She is encouraged to strive for BMI less than 40 (initially) to decrease cardiac risk. Advised to exercise no less than 150 minutes per week.    [name], MD    THE PATIENT IS ENCOURAGED TO PRACTICE SOCIAL DISTANCING DUE TO THE COVID-19 PANDEMIC.

## 2019-11-16 NOTE — Patient Instructions (Signed)
Health Maintenance, Female Adopting a healthy lifestyle and getting preventive care are important in promoting health and wellness. Ask your health care provider about:  The right schedule for you to have regular tests and exams.  Things you can do on your own to prevent diseases and keep yourself healthy. What should I know about diet, weight, and exercise? Eat a healthy diet   Eat a diet that includes plenty of vegetables, fruits, low-fat dairy products, and lean protein.  Do not eat a lot of foods that are high in solid fats, added sugars, or sodium. Maintain a healthy weight Body mass index (BMI) is used to identify weight problems. It estimates body fat based on height and weight. Your health care provider can help determine your BMI and help you achieve or maintain a healthy weight. Get regular exercise Get regular exercise. This is one of the most important things you can do for your health. Most adults should:  Exercise for at least 150 minutes each week. The exercise should increase your heart rate and make you sweat (moderate-intensity exercise).  Do strengthening exercises at least twice a week. This is in addition to the moderate-intensity exercise.  Spend less time sitting. Even light physical activity can be beneficial. Watch cholesterol and blood lipids Have your blood tested for lipids and cholesterol at 59 years of age, then have this test every 5 years. Have your cholesterol levels checked more often if:  Your lipid or cholesterol levels are high.  You are older than 59 years of age.  You are at high risk for heart disease. What should I know about cancer screening? Depending on your health history and family history, you may need to have cancer screening at various ages. This may include screening for:  Breast cancer.  Cervical cancer.  Colorectal cancer.  Skin cancer.  Lung cancer. What should I know about heart disease, diabetes, and high blood  pressure? Blood pressure and heart disease  High blood pressure causes heart disease and increases the risk of stroke. This is more likely to develop in people who have high blood pressure readings, are of African descent, or are overweight.  Have your blood pressure checked: ? Every 3-5 years if you are 18-39 years of age. ? Every year if you are 40 years old or older. Diabetes Have regular diabetes screenings. This checks your fasting blood sugar level. Have the screening done:  Once every three years after age 40 if you are at a normal weight and have a low risk for diabetes.  More often and at a younger age if you are overweight or have a high risk for diabetes. What should I know about preventing infection? Hepatitis B If you have a higher risk for hepatitis B, you should be screened for this virus. Talk with your health care provider to find out if you are at risk for hepatitis B infection. Hepatitis C Testing is recommended for:  Everyone born from 1945 through 1965.  Anyone with known risk factors for hepatitis C. Sexually transmitted infections (STIs)  Get screened for STIs, including gonorrhea and chlamydia, if: ? You are sexually active and are younger than 59 years of age. ? You are older than 59 years of age and your health care provider tells you that you are at risk for this type of infection. ? Your sexual activity has changed since you were last screened, and you are at increased risk for chlamydia or gonorrhea. Ask your health care provider if   you are at risk.  Ask your health care provider about whether you are at high risk for HIV. Your health care provider may recommend a prescription medicine to help prevent HIV infection. If you choose to take medicine to prevent HIV, you should first get tested for HIV. You should then be tested every 3 months for as long as you are taking the medicine. Pregnancy  If you are about to stop having your period (premenopausal) and  you may become pregnant, seek counseling before you get pregnant.  Take 400 to 800 micrograms (mcg) of folic acid every day if you become pregnant.  Ask for birth control (contraception) if you want to prevent pregnancy. Osteoporosis and menopause Osteoporosis is a disease in which the bones lose minerals and strength with aging. This can result in bone fractures. If you are 65 years old or older, or if you are at risk for osteoporosis and fractures, ask your health care provider if you should:  Be screened for bone loss.  Take a calcium or vitamin D supplement to lower your risk of fractures.  Be given hormone replacement therapy (HRT) to treat symptoms of menopause. Follow these instructions at home: Lifestyle  Do not use any products that contain nicotine or tobacco, such as cigarettes, e-cigarettes, and chewing tobacco. If you need help quitting, ask your health care provider.  Do not use street drugs.  Do not share needles.  Ask your health care provider for help if you need support or information about quitting drugs. Alcohol use  Do not drink alcohol if: ? Your health care provider tells you not to drink. ? You are pregnant, may be pregnant, or are planning to become pregnant.  If you drink alcohol: ? Limit how much you use to 0-1 drink a day. ? Limit intake if you are breastfeeding.  Be aware of how much alcohol is in your drink. In the [place] one drink equals one 12 oz bottle of beer (355 mL), one 5 oz glass of wine (148 mL), or one 1 oz glass of hard liquor (44 mL). General instructions  Schedule regular health, dental, and eye exams.  Stay current with your vaccines.  Tell your health care provider if: ? You often feel depressed. ? You have ever been abused or do not feel safe at home. Summary  Adopting a healthy lifestyle and getting preventive care are important in promoting health and wellness.  Follow your health care provider's instructions about healthy  diet, exercising, and getting tested or screened for diseases.  Follow your health care provider's instructions on monitoring your cholesterol and blood pressure. This information is not intended to replace advice given to you by your health care provider. Make sure you discuss any questions you have with your health care provider. Document Revised: 07/01/2018 Document Reviewed: 07/01/2018 Elsevier Patient Education  2020 [employer]  

## 2019-11-17 LAB — CBC
Hematocrit: 40.3 % (ref 34.0–46.6)
Hemoglobin: 13.6 g/dL (ref 11.1–15.9)
MCH: 28.7 pg (ref 26.6–33.0)
MCHC: 33.7 g/dL (ref 31.5–35.7)
MCV: 85 fL (ref 79–97)
Platelets: 417 10*3/uL (ref 150–450)
RBC: 4.74 x10E6/uL (ref 3.77–5.28)
RDW: 14.5 % (ref 11.7–15.4)
WBC: 3.9 10*3/uL (ref 3.4–10.8)

## 2019-11-17 LAB — CMP14+EGFR
ALT: 15 IU/L (ref 0–32)
AST: 16 IU/L (ref 0–40)
Albumin/Globulin Ratio: 1.7 (ref 1.2–2.2)
Albumin: 4.5 g/dL (ref 3.8–4.9)
Alkaline Phosphatase: 78 IU/L (ref 39–117)
BUN/Creatinine Ratio: 9 (ref 9–23)
BUN: 7 mg/dL (ref 6–24)
Bilirubin Total: 0.3 mg/dL (ref 0.0–1.2)
CO2: 24 mmol/L (ref 20–29)
Calcium: 8.8 mg/dL (ref 8.7–10.2)
Chloride: 99 mmol/L (ref 96–106)
Creatinine, Ser: 0.76 mg/dL (ref 0.57–1.00)
GFR calc Af Amer: 99 mL/min/{1.73_m2} (ref >59)
GFR calc non Af Amer: 86 mL/min/{1.73_m2} (ref >59)
Globulin, Total: 2.7 g/dL (ref 1.5–4.5)
Glucose: 95 mg/dL (ref 65–99)
Potassium: 4.1 mmol/L (ref 3.5–5.2)
Sodium: 140 mmol/L (ref 134–144)
Total Protein: 7.2 g/dL (ref 6.0–8.5)

## 2019-11-17 LAB — LIPID PANEL
Chol/HDL Ratio: 2.9 ratio (ref 0.0–4.4)
Cholesterol, Total: 164 mg/dL (ref 100–199)
HDL: 56 mg/dL (ref >39)
LDL Chol Calc (NIH): 96 mg/dL (ref 0–99)
Triglycerides: 60 mg/dL (ref 0–149)
VLDL Cholesterol Cal: 12 mg/dL (ref 5–40)

## 2019-11-17 LAB — HEMOGLOBIN A1C
Est. average glucose Bld gHb Est-mCnc: 128 mg/dL
Hgb A1c MFr Bld: 6.1 % — ABNORMAL HIGH (ref 4.8–5.6)

## 2019-11-17 LAB — TSH: TSH: 2.34 u[IU]/mL (ref 0.450–4.500)

## 2019-11-17 LAB — T4, FREE: Free T4: 1.8 ng/dL — ABNORMAL HIGH (ref 0.82–1.77)

## 2019-12-16 ENCOUNTER — Telehealth: Payer: Self-pay

## 2019-12-16 NOTE — Telephone Encounter (Signed)
Pt notified that provider received records from [place] and that she has a history of meningioma. Pt will follow up with provider

## 2020-01-03 ENCOUNTER — Encounter: Payer: Self-pay | Admitting: Internal Medicine

## 2020-04-19 ENCOUNTER — Other Ambulatory Visit: Payer: Self-pay | Admitting: Internal Medicine

## 2020-05-17 ENCOUNTER — Ambulatory Visit: Payer: 59 | Admitting: Internal Medicine

## 2020-05-31 ENCOUNTER — Other Ambulatory Visit: Payer: Self-pay

## 2020-05-31 ENCOUNTER — Encounter: Payer: Self-pay | Admitting: Internal Medicine

## 2020-05-31 ENCOUNTER — Ambulatory Visit (INDEPENDENT_AMBULATORY_CARE_PROVIDER_SITE_OTHER): Payer: 59 | Admitting: Internal Medicine

## 2020-05-31 VITALS — BP 132/76 | HR 90 | Temp 97.9°F | Ht <= 58 in | Wt 225.0 lb

## 2020-05-31 DIAGNOSIS — R519 Headache, unspecified: Secondary | ICD-10-CM

## 2020-05-31 DIAGNOSIS — Z23 Encounter for immunization: Secondary | ICD-10-CM | POA: Diagnosis not present

## 2020-05-31 DIAGNOSIS — R030 Elevated blood-pressure reading, without diagnosis of hypertension: Secondary | ICD-10-CM | POA: Diagnosis not present

## 2020-05-31 DIAGNOSIS — D329 Benign neoplasm of meninges, unspecified: Secondary | ICD-10-CM | POA: Diagnosis not present

## 2020-05-31 DIAGNOSIS — E039 Hypothyroidism, unspecified: Secondary | ICD-10-CM | POA: Diagnosis not present

## 2020-05-31 DIAGNOSIS — R7309 Other abnormal glucose: Secondary | ICD-10-CM

## 2020-05-31 DIAGNOSIS — Z6841 Body Mass Index (BMI) 40.0 and over, adult: Secondary | ICD-10-CM

## 2020-05-31 NOTE — Progress Notes (Signed)
Rutherford Nail as a scribe for [name], MD.,have documented all relevant documentation on the behalf of [name], MD,as directed by  [name], MD while in the presence of [name], MD. This visit occurred during the SARS-CoV-2 public health emergency.  Safety protocols were in place, including screening questions prior to the visit, additional usage of staff PPE, and extensive cleaning of exam room while observing appropriate contact time as indicated for disinfecting solutions.  Subjective:     Patient ID: Carla Dixon , female    DOB: 1961-01-15 , 59 y.o.   MRN: [id number]   Chief Complaint  Patient presents with  . Hypothyroidism    HPI  Patient is here today for thyroid follow up. She would like to discuss seeing a neurologist for her headaches.  She reports h/o meningioma. Headaches described as dull, throbbing. Usually occur at back of head. Denies visual disturbances.   Thyroid Problem Presents for follow-up visit. Patient reports no cold intolerance, constipation, fatigue or palpitations. The symptoms have been stable.     Past Medical History:  Diagnosis Date  . Graves' disease   . Hypothyroidism    S/P thyroidectomy  . Pericardial effusion   . Pneumonia "several times"  . Pulmonary aspiration of fluid (HCC)      Family History  Problem Relation Age of Onset  . Hypertension Mother   . Diabetes Mother   . Gout Mother   . Heart disease Father   . Colon cancer Father   . Heart Problems Father      Current Outpatient Medications:  .  SYNTHROID 125 MCG tablet, TAKE 1 TABLET BY MOUTH DAILY BEFORE BREAKFAST., Disp: 90 tablet, Rfl: 1 .  valACYclovir (VALTREX) 500 MG tablet, Take 500 mg by mouth 2 (two) times daily., Disp: , Rfl:    Allergies  Allergen Reactions  . Neosporin Original [Bacitracin-Neomycin-Polymyxin] Rash     Review of Systems  Constitutional: Negative.  Negative for fatigue.  HENT: Negative.   Respiratory:  Negative.   Cardiovascular: Negative for palpitations.  Gastrointestinal: Negative for constipation.  Endocrine: Negative for cold intolerance, polydipsia, polyphagia and polyuria.  Musculoskeletal: Negative.   Skin: Negative.   Neurological: Negative for dizziness.  Psychiatric/Behavioral: Negative.      Today's Vitals   05/31/20 1641  BP: 132/76  Pulse: 90  Temp: 97.9 F (36.6 C)  TempSrc: Oral  Weight: 225 lb (102.1 kg)  Height: 4' 10"  (1.473 m)   Body mass index is 47.03 kg/m.  Wt Readings from Last 3 Encounters:  05/31/20 225 lb (102.1 kg)  11/16/19 218 lb (98.9 kg)  03/22/19 215 lb 9.6 oz (97.8 kg)     Objective:  Physical Exam      Assessment And Plan:     1. Primary hypothyroidism Comments: Chronic, I will check thyroid panel and adjust meds as needed.  - TSH - T4, Free  2. New onset headache Comments: Sx suggestive of tension h/a. She has h/o menigioma. I will refer her to Neuro for further evaluation. Will also check MRI to assess for stability. - Ambulatory referral to Neurology  3. Meningioma ([place]) Comments: I will order MRI as indicated above.   4. Elevated blood pressure reading Comments: She is encouraged to cut back on her salt intake and to avoid processed/packaged foods which tend to be high in sodium.  - BMP8+EGFR  5. Other abnormal glucose Comments: Her a1c has been elevated in the past. I will recheck a1c  today. She is encouraged to avoid sugary beverages, including sodas, teas and diet drinks.  - Hemoglobin A1c - Insulin, random(561)  6. Class 3 severe obesity due to excess calories with serious comorbidity and body mass index (BMI) of 45.0 to 49.9 in adult [facility]) Comments: She is encouraged to lose ten percent of her body weight. She agrees to referral to [facility]. Importance of regular exercise was stressed to pt. - Ambulatory referral to Internal Medicine  7. Need for influenza vaccination - Flu Vaccine QUAD 6+ mos  PF IM (Fluarix Quad PF)      Patient was given opportunity to ask questions. Patient verbalized understanding of the plan and was able to repeat key elements of the plan. All questions were answered to their satisfaction.  [name], MD   I, [name], MD, have reviewed all documentation for this visit. The documentation on 06/08/20 for the exam, diagnosis, procedures, and orders are all accurate and complete.  THE PATIENT IS ENCOURAGED TO PRACTICE SOCIAL DISTANCING DUE TO THE COVID-19 PANDEMIC.

## 2020-05-31 NOTE — Patient Instructions (Addendum)
Meningioma Meningioma is a tumor that occurs in the thin tissue that covers the brain and spinal cord (meninges). Meningiomas are usually not cancerous (benign) and do not spread to other areas. In rare cases, a meningioma may become cancerous (malignant). What are the causes? In many cases, the cause of this condition is not known. In some cases, meningioma may be caused by:  Having a genetic disorder that causes multiple soft tumors (neurofibromatosis 2).  A change in certain genes (genetic mutation). What increases the risk? You are more likely to develop this condition if:  You have been exposed to radiation.  You are an older woman. Older women have a higher risk of meningiomas than men or children. However, men have a higher risk of malignant meningiomas.  You have injured your head in the past.  You have a history of breast cancer. What are the signs or symptoms? Symptoms of this condition usually begin very slowly. The symptoms may depend on the size and location of the tumor. Possible symptoms include:  Headaches.  Nausea and vomiting.  Vision changes.  Hearing changes.  Loss of the sense of smell.  Fits of uncontrolled movements (seizures).  Weakness or numbness on one side of the body or in an arm or leg.  Mood or personality changes.  Problems with memory or thinking. How is this diagnosed? This condition is diagnosed based on:  Results of brain imaging tests, such as a CT scan or MRI.  Removal and testing of a sample of the tumor (biopsy). This may be done to confirm the diagnosis and to help determine the best treatment for the condition. How is this treated? You may not have treatment until your symptoms start to affect your daily activities. This is because meningioma grows so slowly, and your health care provider may prefer to monitor its growth before starting treatment. If you do need treatment, it may include:  Medicines to decrease brain swelling  and improve symptoms (steroids).  High-energy rays (radiation therapy) to shrink or kill the tumor.  Anti-cancer medicines (chemotherapy) to shrink or kill the tumor. Chemotherapy has many side effects because it also kills healthy cells.  Targeted therapy. This kills cancerous cells without affecting normal cells.  Surgery to remove as much of the tumor as possible. Follow these instructions at home:   Take over-the-counter and prescription medicines only as told by your health care provider.  Keep all follow-up visits as told by your health care provider. This is important. You may need regular visits to monitor the growth of your tumor. Contact a health care provider if:  You have symptoms that come back.  You have diarrhea.  You vomit.  You have abdominal pain.  You cannot eat or drink as much as you need.  You are weaker or more tired than usual.  You are losing weight without trying. Get help right away if:  Your diarrhea, vomiting, or abdominal pain does not go away.  You have new symptoms, such as vision problems or difficulty walking.  You have a seizure.  You have bleeding that does not stop.  You have trouble breathing.  You have a fever. Summary  Meningioma is a tumor that occurs in the thin tissue that covers the brain and spinal cord (meninges).  Meningiomas are usually benign, which means they are not cancerous and do not spread to other areas.  Symptoms of this condition usually begin very slowly. The symptoms may depend on the size and location of  the tumor.  Your tumor may be monitored over time. You may not need treatment until your tumor starts to affect your daily life. This information is not intended to replace advice given to you by your health care provider. Make sure you discuss any questions you have with your health care provider. Document Revised: 06/20/2017 Document Reviewed: 07/12/2016 Elsevier Patient Education  2020 [employer].

## 2020-06-01 LAB — BMP8+EGFR
BUN/Creatinine Ratio: 16 (ref 9–23)
BUN: 12 mg/dL (ref 6–24)
CO2: 27 mmol/L (ref 20–29)
Calcium: 9.5 mg/dL (ref 8.7–10.2)
Chloride: 98 mmol/L (ref 96–106)
Creatinine, Ser: 0.73 mg/dL (ref 0.57–1.00)
GFR calc Af Amer: 104 mL/min/{1.73_m2} (ref >59)
GFR calc non Af Amer: 90 mL/min/{1.73_m2} (ref >59)
Glucose: 88 mg/dL (ref 65–99)
Potassium: 4.4 mmol/L (ref 3.5–5.2)
Sodium: 140 mmol/L (ref 134–144)

## 2020-06-01 LAB — T4, FREE: Free T4: 1.74 ng/dL (ref 0.82–1.77)

## 2020-06-01 LAB — INSULIN, RANDOM: INSULIN: 56.9 u[IU]/mL — ABNORMAL HIGH (ref 2.6–24.9)

## 2020-06-01 LAB — HEMOGLOBIN A1C
Est. average glucose Bld gHb Est-mCnc: 131 mg/dL
Hgb A1c MFr Bld: 6.2 % — ABNORMAL HIGH (ref 4.8–5.6)

## 2020-06-01 LAB — TSH: TSH: 2.74 u[IU]/mL (ref 0.450–4.500)

## 2020-06-08 DIAGNOSIS — E039 Hypothyroidism, unspecified: Secondary | ICD-10-CM | POA: Insufficient documentation

## 2020-06-08 DIAGNOSIS — R030 Elevated blood-pressure reading, without diagnosis of hypertension: Secondary | ICD-10-CM | POA: Insufficient documentation

## 2020-06-08 DIAGNOSIS — D329 Benign neoplasm of meninges, unspecified: Secondary | ICD-10-CM | POA: Insufficient documentation

## 2020-06-08 DIAGNOSIS — R519 Headache, unspecified: Secondary | ICD-10-CM | POA: Insufficient documentation

## 2020-06-30 ENCOUNTER — Ambulatory Visit
Admission: RE | Admit: 2020-06-30 | Discharge: 2020-06-30 | Disposition: A | Discharge status: Home / Self Care | Payer: 59 | Source: Ambulatory Visit | Attending: Internal Medicine | Admitting: Internal Medicine

## 2020-06-30 ENCOUNTER — Other Ambulatory Visit: Payer: Self-pay

## 2020-06-30 DIAGNOSIS — D329 Benign neoplasm of meninges, unspecified: Secondary | ICD-10-CM

## 2020-06-30 MED ORDER — GADOBENATE DIMEGLUMINE 529 MG/ML IV SOLN
20.0000 mL | Freq: Once | INTRAVENOUS | Status: AC | PRN
  Administered 2020-06-30: 20 mL via INTRAVENOUS

## 2020-07-05 ENCOUNTER — Encounter: Payer: Self-pay | Admitting: Neurology

## 2020-07-05 ENCOUNTER — Ambulatory Visit (INDEPENDENT_AMBULATORY_CARE_PROVIDER_SITE_OTHER): Payer: 59 | Admitting: Neurology

## 2020-07-05 VITALS — BP 154/87 | HR 79 | Ht 58.5 in | Wt 219.0 lb

## 2020-07-05 DIAGNOSIS — Z86018 Personal history of other benign neoplasm: Secondary | ICD-10-CM

## 2020-07-05 DIAGNOSIS — G4733 Obstructive sleep apnea (adult) (pediatric): Secondary | ICD-10-CM

## 2020-07-05 DIAGNOSIS — R519 Headache, unspecified: Secondary | ICD-10-CM

## 2020-07-05 NOTE — Progress Notes (Signed)
Subjective:    Patient ID: Carla Dixon is a 59 y.o. female.  HPI     Interim history:   Dear Dr. Baird Cancer,    I saw your patient, Carla Dixon, upon your kind request, in my neurologic clinic today for initial consultation of her Headaches.  The patient is unaccompanied today.  As you know, Ms. Carla Dixon is a 59 year old right-handed woman with an underlying medical history of pneumonia, pericardial effusion, history of Graves' disease, hypothyroidism, meningioma, and severe obesity with a BMI of over 40, who reports intermittent short-lived headaches for the past few years. She does not have a history of head injury or migraines. Her headache is in the back of her head, sometimes more on the right side, lasts a few minutes typically, not associated with any neurological accompaniments or nausea or vomiting or blurry vision. She does not typically take anything for her headaches. She was diagnosed with a meningioma several years ago, she was living in [place] at the time, it was probably in or around 2009. Last brain MRI before she moved here was in 2015.  She denies any one-sided sudden onset of weakness or numbness or tingling or droopy face or slurring of speech. She has not fallen. She did not end up seeking treatment for sleep apnea back in 2019  I reviewed your office note from 05/31/2020.  You ordered a brain MRI.  She had a brain MRI with and without contrast at [facility] on 06/30/2020 and I reviewed the results:   IMPRESSION: 1. 1.8 cm posterior fossa meningioma. Mild local mass effect without edema. 2. Otherwise unremarkable appearance of the brain within limitations of artifact from braces.  I have evaluated her in the past for sleep apnea concerns, patient had sleep testing in July 2019 as well as August 2019 but did not pursue CPAP therapy at the time.  Her baseline sleep study from 02/18/2018 showed an AHI of 9.5/h, REM AHI of 76.6/h, O2 nadir of 78% with absence of supine REM  sleep.  Titration study from 03/03/2018 showed a effective CPAP pressure of 9 cm for resolution of her sleep disordered breathing.  She has had interim weight gain in the realm of 15 pounds since I evaluated her for sleep apnea.   She would be willing to try CPAP therapy. She is working on weight loss. She is currently declining a referral to neurosurgery for evaluation of her meningioma and further management.  She had an eye examination within the last year. She has updated prescription eyeglasses.  Previously:   02/04/18: 59 year old right-handed woman with an underlying medical history of Graves' disease status post thyroidectomy in 03/2015 with postoperative hypothyroidism, vitamin D deficiency, Hx of pericardial effusion, history of pneumonia, and obesity, who reports snoring and excessive daytime somnolence. I reviewed your office note from 11/12/2017, which you kindly included. Her Epworth sleepiness score is 5 out of 24, fatigue score is 16 out of 63. She is single and lives with her family which includes mother, cousins' kids, and sister stays sometimes. Sadly she lost her maternal aunt about 3 weeks ago. She is a nonsmoker and does not utilize alcohol, she drinks caffeine in the form of soda and tea, about 2 servings per day. She works part-time as a Optometrist for [employer]. She has a brother with CPAP. She does not always wake up fully rested. She does not keep a set schedule for her sleep and rise time. Bedtime is generally late, as late as 4 AM,  generally in bed around 2. She watches TV in her bedroom and tries to turn the TV off before falling asleep. Rise time is typically between 8 and 10. She does not have nighttime nocturia or morning headaches. She denies restless leg symptoms. She would be willing to get tested for sleep apnea and try CPAP therapy. She is working on weight loss. She's currently on phentermine. She moved to [place] some 4 years ago. She had a home sleep test some 6  years ago with unclear results.   Her Past Medical History Is Significant For: Past Medical History:  Diagnosis Date  . Graves' disease   . Hypothyroidism    S/P thyroidectomy  . Pericardial effusion   . Pneumonia "several times"  . Pulmonary aspiration of fluid (HCC)     Her Past Surgical History Is Significant For: Past Surgical History:  Procedure Laterality Date  . COLONOSCOPY    . DIAGNOSTIC LAPAROSCOPY WITH REMOVAL OF ECTOPIC PREGNANCY  1990's  . THYROIDECTOMY N/A 04/03/2015   Procedure: TOTAL THYROIDECTOMY;  Surgeon: Jackolyn Confer, MD;  Location: [facility];  Service: General;  Laterality: N/A;  . TOTAL THYROIDECTOMY  04/03/2015    Her Family History Is Significant For: Family History  Problem Relation Age of Onset  . Hypertension Mother   . Diabetes Mother   . Gout Mother   . Heart disease Father   . Colon cancer Father   . Heart Problems Father     Her Social History Is Significant For: Social History   Socioeconomic History  . Marital status: Significant Other    Spouse name: Not on file  . Number of children: Not on file  . Years of education: Not on file  . Highest education level: Not on file  Occupational History  . Not on file  Tobacco Use  . Smoking status: Never Smoker  . Smokeless tobacco: Never Used  Vaping Use  . Vaping Use: Never used  Substance and Sexual Activity  . Alcohol use: No  . Drug use: No  . Sexual activity: Yes  Other Topics Concern  . Not on file  Social History Narrative  . Not on file   Social Determinants of Health   Financial Resource Strain: Not on file  Food Insecurity: Not on file  Transportation Needs: Not on file  Physical Activity: Not on file  Stress: Not on file  Social Connections: Not on file    Her Allergies Are:  Allergies  Allergen Reactions  . Neosporin Original [Bacitracin-Neomycin-Polymyxin] Rash  :   Her Current Medications Are:  Outpatient Encounter Medications as of 07/05/2020  Medication  Sig  . SYNTHROID 125 MCG tablet TAKE 1 TABLET BY MOUTH DAILY BEFORE BREAKFAST.  . valACYclovir (VALTREX) 500 MG tablet Take 500 mg by mouth 2 (two) times daily.   No facility-administered encounter medications on file as of 07/05/2020.  :  Review of Systems:  Out of a complete 14 point review of systems, all are reviewed and negative with the exception of these symptoms as listed below: Review of Systems  Neurological:       Pt presents today to discuss her headaches. Pt also wants to discuss her meningioma. Pt reports that she was not contacted by the DME to get set up on cpap.  Epworth Sleepiness Scale 0= would never doze 1= slight chance of dozing 2= moderate chance of dozing 3= high chance of dozing  Sitting and reading: 0 Watching TV: 2 Sitting inactive in a public  place (ex. Theater or meeting): 0 As a passenger in a car for an hour without a break: 0 Lying down to rest in the afternoon: 2 Sitting and talking to someone: 0 Sitting quietly after lunch (no alcohol): 1 In a car, while stopped in traffic: 0 Total: 5     Objective:  Neurological Exam  Physical Exam Physical Examination:   Vitals:   07/05/20 0942  BP: (!) 154/87  Pulse: 79    General Examination: The patient is a very pleasant 59 y.o. female in no acute distress. She appears well-developed and well-nourished and well groomed.   HEENT: Normocephalic, atraumatic, pupils are equal, round and reactive to light, funduscopic exam shows no obvious swelling of the disc, extraocular tracking is well preserved, face is symmetric with normal facial animation, hearing grossly intact, speech is clear without dysarthria, hypophonia or voice tremor. She has no carotid bruits on auscultation, airway examination reveals mild to moderate mouth dryness, adequate dental hygiene, mild to moderate airway crowding noted, she has braces. Unremarkable scar from thyroidectomy.   Chest: Clear to auscultation without wheezing,  rhonchi or crackles noted.  Heart: S1+S2+0, regular and normal without murmurs, rubs or gallops noted.   Abdomen: Soft, non-tender and non-distended.  Extremities: There is no pitting edema in the distal lower extremities bilaterally.   Skin: Warm and dry without trophic changes noted.  Musculoskeletal: exam reveals no obvious joint deformities, tenderness or joint swelling or erythema.   Neurologically:  Mental status: The patient is awake, alert and oriented in all 4 spheres. Her immediate and remote memory, attention, language skills and fund of knowledge are appropriate. There is no evidence of aphasia, agnosia, apraxia or anomia. Speech is clear with normal prosody and enunciation. Thought process is linear. Mood is normal and affect is normal.  Cranial nerves II - XII are as described above under HEENT exam. In addition: shoulder shrug is normal with equal shoulder height noted. Motor exam: Normal bulk, strength and tone is noted. There is no drift, tremor or rebound. Romberg is negative. Reflexes are 1+ throughout. Toes are downgoing bilaterally. Fine motor skills and coordination: grossly intact.  Cerebellar testing: No dysmetria or intention tremor. There is no truncal or gait ataxia. Finger-to-nose and heel-to-shin normal bilaterally. Sensory exam: intact to light touch in the upper and lower extremities.  Gait, station and balance: She stands easily. No veering to one side is noted. No leaning to one side is noted. Posture is age-appropriate and stance is narrow based. Gait shows normal stride length and normal pace. No problems turning are noted. Tandem walk is unremarkable.   Assessment and Plan:   In summary, Wesleigh Lynam is a very pleasant 59 year old female with an underlying medical history of Graves' disease status post thyroidectomy in 03/2015 with postoperative hypothyroidism, vitamin D deficiency, Hx of pericardial effusion, history of pneumonia, history of  meningioma, and severe obesity with a BMI of over 40, who presents for evaluation of her intermittent headaches. She has milder, short-lived headaches, she does report going weeks or sometimes months without a headache. She does not have a history of migraines and description is not migrainous. She had a recent brain MRI which confirmed presence of meningioma. She is encouraged to discuss a surgical referral with your office if need be, she currently declines neurosurgical evaluation. She does not typically take any medication for her headaches. She was diagnosed with mild obstructive sleep apnea in 2019 but had significant obstructive sleep apnea during REM sleep with  desaturation as low as 78%. She never embarked on any CPAP therapy but would be willing to try CPAP therapy at this time. She is advised that it could help with her headaches and also achieving weight loss more successfully. She is willing to start CPAP therapy and advised that we will send the order to a local DME company, we also provided the phone number and address to [facility]. She is advised to follow-up routinely to see one of our nurse practitioners for symptom recheck and compliance recheck in about 3 months after being set up with CPAP. Neurological exam is nonfocal and she is reassured in that regard. I answered all her questions today and she was in agreement with the plan.  Thank you very much for allowing me to participate in the care of this nice patient. If I can be of any further assistance to you please do not hesitate to call me at [phone].  Sincerely,   Star Age, MD, PhD

## 2020-07-05 NOTE — Progress Notes (Signed)
Order for cpap sent to [facility] via community message. Confirmation received that the order transmitted was successful.  

## 2020-07-05 NOTE — Patient Instructions (Addendum)
As discussed if you wish to seek consultation with a neurosurgeon for your meningioma, you can ask Dr. Baird Cancer to make a referral. I can also make a referral if you like.  Your neurological exam is normal. Thankfully, your headaches are not severe or frequent. We can continue to monitor.  As discussed, treatment of sleep apnea can improve headaches and daytime energy. We will start you on CPAP therapy based on the studies from 2019. If you need a diagnostic reevaluation per insurance requirement, we can do a home sleep test. We will let you know. You should expect a phone call from a DME company about getting set up for CPAP.  The address for [facility] is: [address] [phone].

## 2020-10-10 ENCOUNTER — Ambulatory Visit: Payer: 59 | Admitting: Family Medicine

## 2020-11-16 ENCOUNTER — Other Ambulatory Visit: Payer: Self-pay | Admitting: Internal Medicine

## 2020-11-21 ENCOUNTER — Encounter: Payer: 59 | Admitting: Internal Medicine

## 2020-11-21 LAB — CBC AND DIFFERENTIAL
Hemoglobin: 13.5 (ref 12.0–16.0)
Platelets: 435 — AB (ref 150–399)
WBC: 5.1

## 2020-11-21 LAB — COMPREHENSIVE METABOLIC PANEL
Albumin: 4.4 (ref 3.5–5.0)
GFR calc Af Amer: 103
GFR calc non Af Amer: 85
Globulin: 2.9

## 2020-11-21 LAB — CBC: RBC: 4.67 (ref 3.87–5.11)

## 2020-11-21 LAB — LIPID PANEL
Cholesterol: 183 (ref 0–200)
HDL: 57 (ref 35–70)
LDL Cholesterol: 109
Triglycerides: 87 (ref 40–160)

## 2020-11-21 LAB — HEPATIC FUNCTION PANEL
ALT: 16 (ref 7–35)
AST: 18 (ref 13–35)
Alkaline Phosphatase: 63 (ref 25–125)
Bilirubin, Total: 0.4

## 2020-11-21 LAB — BASIC METABOLIC PANEL
BUN: 13 (ref 4–21)
CO2: 34 — AB (ref 13–22)
Creatinine: 0.7 (ref 0.5–1.1)
Glucose: 83

## 2020-11-21 LAB — TSH: TSH: 0.68 (ref 0.41–5.90)

## 2020-12-13 ENCOUNTER — Other Ambulatory Visit: Payer: Self-pay

## 2020-12-13 ENCOUNTER — Encounter: Payer: Self-pay | Admitting: Nurse Practitioner

## 2020-12-13 ENCOUNTER — Ambulatory Visit (INDEPENDENT_AMBULATORY_CARE_PROVIDER_SITE_OTHER): Payer: 59 | Admitting: Nurse Practitioner

## 2020-12-13 VITALS — BP 138/72 | HR 75 | Temp 97.9°F | Ht <= 58 in | Wt 207.2 lb

## 2020-12-13 DIAGNOSIS — E039 Hypothyroidism, unspecified: Secondary | ICD-10-CM

## 2020-12-13 DIAGNOSIS — R7303 Prediabetes: Secondary | ICD-10-CM | POA: Diagnosis not present

## 2020-12-13 DIAGNOSIS — M1712 Unilateral primary osteoarthritis, left knee: Secondary | ICD-10-CM

## 2020-12-13 DIAGNOSIS — Z0000 Encounter for general adult medical examination without abnormal findings: Secondary | ICD-10-CM

## 2020-12-13 DIAGNOSIS — Z6841 Body Mass Index (BMI) 40.0 and over, adult: Secondary | ICD-10-CM

## 2020-12-13 NOTE — Patient Instructions (Signed)
Health Maintenance, Female Adopting a healthy lifestyle and getting preventive care are important in promoting health and wellness. Ask your health care provider about:  The right schedule for you to have regular tests and exams.  Things you can do on your own to prevent diseases and keep yourself healthy. What should I know about diet, weight, and exercise? Eat a healthy diet  Eat a diet that includes plenty of vegetables, fruits, low-fat dairy products, and lean protein.  Do not eat a lot of foods that are high in solid fats, added sugars, or sodium.   Maintain a healthy weight Body mass index (BMI) is used to identify weight problems. It estimates body fat based on height and weight. Your health care provider can help determine your BMI and help you achieve or maintain a healthy weight. Get regular exercise Get regular exercise. This is one of the most important things you can do for your health. Most adults should:  Exercise for at least 150 minutes each week. The exercise should increase your heart rate and make you sweat (moderate-intensity exercise).  Do strengthening exercises at least twice a week. This is in addition to the moderate-intensity exercise.  Spend less time sitting. Even light physical activity can be beneficial. Watch cholesterol and blood lipids Have your blood tested for lipids and cholesterol at 60 years of age, then have this test every 5 years. Have your cholesterol levels checked more often if:  Your lipid or cholesterol levels are high.  You are older than 60 years of age.  You are at high risk for heart disease. What should I know about cancer screening? Depending on your health history and family history, you may need to have cancer screening at various ages. This may include screening for:  Breast cancer.  Cervical cancer.  Colorectal cancer.  Skin cancer.  Lung cancer. What should I know about heart disease, diabetes, and high blood  pressure? Blood pressure and heart disease  High blood pressure causes heart disease and increases the risk of stroke. This is more likely to develop in people who have high blood pressure readings, are of African descent, or are overweight.  Have your blood pressure checked: ? Every 3-5 years if you are 18-39 years of age. ? Every year if you are 40 years old or older. Diabetes Have regular diabetes screenings. This checks your fasting blood sugar level. Have the screening done:  Once every three years after age 40 if you are at a normal weight and have a low risk for diabetes.  More often and at a younger age if you are overweight or have a high risk for diabetes. What should I know about preventing infection? Hepatitis B If you have a higher risk for hepatitis B, you should be screened for this virus. Talk with your health care provider to find out if you are at risk for hepatitis B infection. Hepatitis C Testing is recommended for:  Everyone born from 1945 through 1965.  Anyone with known risk factors for hepatitis C. Sexually transmitted infections (STIs)  Get screened for STIs, including gonorrhea and chlamydia, if: ? You are sexually active and are younger than 60 years of age. ? You are older than 60 years of age and your health care provider tells you that you are at risk for this type of infection. ? Your sexual activity has changed since you were last screened, and you are at increased risk for chlamydia or gonorrhea. Ask your health care provider   if you are at risk.  Ask your health care provider about whether you are at high risk for HIV. Your health care provider may recommend a prescription medicine to help prevent HIV infection. If you choose to take medicine to prevent HIV, you should first get tested for HIV. You should then be tested every 3 months for as long as you are taking the medicine. Pregnancy  If you are about to stop having your period (premenopausal) and  you may become pregnant, seek counseling before you get pregnant.  Take 400 to 800 micrograms (mcg) of folic acid every day if you become pregnant.  Ask for birth control (contraception) if you want to prevent pregnancy. Osteoporosis and menopause Osteoporosis is a disease in which the bones lose minerals and strength with aging. This can result in bone fractures. If you are 65 years old or older, or if you are at risk for osteoporosis and fractures, ask your health care provider if you should:  Be screened for bone loss.  Take a calcium or vitamin D supplement to lower your risk of fractures.  Be given hormone replacement therapy (HRT) to treat symptoms of menopause. Follow these instructions at home: Lifestyle  Do not use any products that contain nicotine or tobacco, such as cigarettes, e-cigarettes, and chewing tobacco. If you need help quitting, ask your health care provider.  Do not use street drugs.  Do not share needles.  Ask your health care provider for help if you need support or information about quitting drugs. Alcohol use  Do not drink alcohol if: ? Your health care provider tells you not to drink. ? You are pregnant, may be pregnant, or are planning to become pregnant.  If you drink alcohol: ? Limit how much you use to 0-1 drink a day. ? Limit intake if you are breastfeeding.  Be aware of how much alcohol is in your drink. In the [place] one drink equals one 12 oz bottle of beer (355 mL), one 5 oz glass of wine (148 mL), or one 1 oz glass of hard liquor (44 mL). General instructions  Schedule regular health, dental, and eye exams.  Stay current with your vaccines.  Tell your health care provider if: ? You often feel depressed. ? You have ever been abused or do not feel safe at home. Summary  Adopting a healthy lifestyle and getting preventive care are important in promoting health and wellness.  Follow your health care provider's instructions about healthy  diet, exercising, and getting tested or screened for diseases.  Follow your health care provider's instructions on monitoring your cholesterol and blood pressure. This information is not intended to replace advice given to you by your health care provider. Make sure you discuss any questions you have with your health care provider. Document Revised: 07/01/2018 Document Reviewed: 07/01/2018 Elsevier Patient Education  2021 [employer]  

## 2020-12-13 NOTE — Progress Notes (Signed)
I,Carla Dixon,acting as a Education administrator for [employer], NP.,have documented all relevant documentation on the behalf of [employer], NP,as directed by  Carla Castilla, NP while in the presence of Carla Castilla, NP.  This visit occurred during the SARS-CoV-2 public health emergency.  Safety protocols were in place, including screening questions prior to the visit, additional usage of staff PPE, and extensive cleaning of exam room while observing appropriate contact time as indicated for disinfecting solutions.  Subjective:     Patient ID: Carla Dixon , female    DOB: Dec 15, 1960 , 60 y.o.   MRN: [id number]   Chief Complaint  Patient presents with  . Annual Exam    HPI  She is here today for a full physical exam. She is followed by Dr. Mancel Dixon for her Gyn care. Last October was her paps smear. She also had a mammogram done recently. She is also going to the orthopedic for steroid shot. Left knee injured it on the treadmill. She is using the cane.  Exercise: She is trying. Walking Diet: She is not eating a lot of red meat, fruit and vegetables. Meal and snack. She is doing intermittent fasting. She eats one big meal and chips.  Drink or smoke: No.   She saw [facility]. She is currently taking phentermine. She recently had lab works completed done there. Reviewed labs with patients. No need for labs today except TSH T4     Past Medical History:  Diagnosis Date  . Graves' disease   . Hypothyroidism    S/P thyroidectomy  . Pericardial effusion   . Pneumonia "several times"  . Pulmonary aspiration of fluid (H[facility])      Family History  Problem Relation Age of Onset  . Hypertension Mother   . Diabetes Mother   . Gout Mother   . Heart disease Father   . Colon cancer Father   . Heart Problems Father      Current Outpatient Medications:  .  SYNTHROID 125 MCG tablet, TAKE 1 TABLET BY MOUTH DAILY BEFORE BREAKFAST. (Patient taking differently: Take whole  tablet Monday-Friday take 1/2 tablet on Saturday and Sunday.), Disp: 90 tablet, Rfl: 1 .  valACYclovir (VALTREX) 500 MG tablet, Take 500 mg by mouth 2 (two) times daily., Disp: , Rfl:  .  phentermine 15 MG capsule, , Disp: , Rfl:    Allergies  Allergen Reactions  . Neosporin Original [Bacitracin-Neomycin-Polymyxin] Rash      The patient states she uses nothing for birth control. Last LMP was No LMP recorded (lmp unknown). Patient is postmenopausal..  Negative for: breast discharge, breast lump(s), breast pain and breast self exam. Associated symptoms include abnormal vaginal bleeding. Pertinent negatives include abnormal bleeding (hematology), anxiety, decreased libido, depression, difficulty falling sleep, dyspareunia, history of infertility, nocturia, sexual dysfunction, sleep disturbances, urinary incontinence, urinary urgency, vaginal discharge and vaginal itching. Diet regular.The patient states her exercise level is    . The patient's tobacco use is: none  Social History   Tobacco Use  Smoking Status Never Smoker  Smokeless Tobacco Never Used  . She has been exposed to passive smoke. The patient's alcohol use is:  Social History   Substance and Sexual Activity  Alcohol Use No  . Additional information: Last pap 2021, next one scheduled for.    Review of Systems  Constitutional: Negative.  Negative for chills and fever.  HENT: Negative.  Negative for congestion and sinus pressure.   Eyes: Negative.   Respiratory: Negative.   Cardiovascular: Negative.  Gastrointestinal: Negative.   Endocrine: Negative.   Genitourinary: Negative.   Musculoskeletal: Negative.   Skin: Negative.   Allergic/Immunologic: Negative.   Neurological: Negative.   Hematological: Negative.   Psychiatric/Behavioral: Negative.      Today's Vitals   12/13/20 1040  BP: 138/72  Pulse: 75  Temp: 97.9 F (36.6 C)  TempSrc: Oral  Weight: 207 lb 3.2 oz (94 kg)  Height: 4' 9.8" (1.468 m)   Body mass  index is 43.61 kg/m.  Wt Readings from Last 3 Encounters:  12/13/20 207 lb 3.2 oz (94 kg)  07/05/20 219 lb (99.3 kg)  05/31/20 225 lb (102.1 kg)    Objective:  Physical Exam Vitals and nursing note reviewed.  Constitutional:      Appearance: Normal appearance.  HENT:     Head: Normocephalic and atraumatic.     Right Ear: Tympanic membrane, ear canal and external ear normal. There is no impacted cerumen.     Left Ear: Tympanic membrane, ear canal and external ear normal. There is no impacted cerumen.     Nose: Nose normal.     Mouth/Throat:     Mouth: Mucous membranes are moist.     Pharynx: Oropharynx is clear.  Eyes:     Extraocular Movements: Extraocular movements intact.     Conjunctiva/sclera: Conjunctivae normal.     Pupils: Pupils are equal, round, and reactive to light.  Cardiovascular:     Rate and Rhythm: Normal rate and regular rhythm.     Pulses: Normal pulses.     Heart sounds: Normal heart sounds. No murmur heard.   Pulmonary:     Effort: Pulmonary effort is normal.     Breath sounds: Normal breath sounds. No wheezing.  Chest:  Breasts:     Tanner Score is 5.     Right: Normal.     Left: Normal.    Abdominal:     General: Abdomen is flat. Bowel sounds are normal.     Palpations: Abdomen is soft.  Genitourinary:    Comments: deferred Musculoskeletal:        General: Normal range of motion.     Cervical back: Normal range of motion and neck supple.  Skin:    General: Skin is warm and dry.     Capillary Refill: Capillary refill takes less than 2 seconds.     Coloration: Skin is not jaundiced.  Neurological:     General: No focal deficit present.     Mental Status: She is alert and oriented to person, place, and time.  Psychiatric:        Mood and Affect: Mood normal.        Behavior: Behavior normal.         Assessment And Plan:     1. Routine general medical examination at health care facility -Patient is here for their annual physical exam  and we discussed any changes to medication and medical history.  -Behavior modification was discussed as well as diet and exercise history  -Patient will continue to exercise regularly and modify their diet.  -Recommendation for yearly physical annuals, immunization and screenings including mammogram and colonoscopy were discussed with the patient.  -Recommended intake of multivitamin, vitamin D and calcium.  -Individualized advise was given to the patient pertaining to their own health history in regards to diet, exercise, medical condition and referrals.  -Reviewed with patient her recent labs from [place] center. No need for further labs today. Reviewed CBC, BMP, Hgb A1c, Lipid  panel, Vit D, PTH with her.   2. Primary hypothyroidism -Chronic, Stable -Continue meds  -Will check labs and assess.  - TSH + free T4  3. Prediabetes -Patient is currently at 6.1 -Encouraged patient to continue her diet and exercise  -Eliminate carbs and decrease amount of sugar intake   4. Primary osteoarthritis of left knee -Patient will see a orthopedic tomorrow for a steroid injection.   5. Class 3 severe obesity due to excess calories without serious comorbidity with body mass index (BMI) of 40.0 to 44.9 in adult [facility]) -Patient was recently seen at [place] [facility] for weight loss and started on phentermine. She is tolerating that well.   Staying healthy and adopting a healthy lifestyle for your overall health is important. You should eat 7 or more servings of fruits and vegetables per day. You should drink plenty of water to keep yourself hydrated and your kidneys healthy. This includes about 65-80+ fluid ounces of water. Limit your intake of animal fats especially for elevated cholesterol. Avoid highly processed food and limit your salt intake if you have hypertension. Avoid foods high in saturated/Trans fats. Along with a healthy diet it is also very important to maintain time for yourself to  maintain a healthy mental health with low stress levels. You should get atleast 150 min of moderate intensity exercise weekly for a healthy heart. Along with eating right and exercising, aim for at least 7-9 hours of sleep daily.  Eat more whole grains which includes barley, wheat berries, oats, brown rice and whole wheat pasta. Use healthy plant oils which include olive, soy, corn, sunflower and peanut. Limit your caffeine and sugary drinks. Limit your intake of fast foods. Limit milk and dairy products to one or two daily servings.   The patient was encouraged to call or send a message through Carla Dixon for any questions or concerns.   Follow up: 6 months   Patient was given opportunity to ask questions. Patient verbalized understanding of the plan and was able to repeat key elements of the plan. All questions were answered to their satisfaction.  Carla Reyanne Hussar, DNP   I, Carla Dixon have reviewed all documentation for this visit. The documentation on 12/13/20 for the exam, diagnosis, procedures, and orders are all accurate and complete.    THE PATIENT IS ENCOURAGED TO PRACTICE SOCIAL DISTANCING DUE TO THE COVID-19 PANDEMIC.

## 2020-12-14 ENCOUNTER — Encounter: Payer: Self-pay | Admitting: Internal Medicine

## 2020-12-14 ENCOUNTER — Other Ambulatory Visit: Payer: Self-pay | Admitting: Nurse Practitioner

## 2020-12-14 DIAGNOSIS — E039 Hypothyroidism, unspecified: Secondary | ICD-10-CM

## 2020-12-14 LAB — TSH+FREE T4
Free T4: 2.26 ng/dL — ABNORMAL HIGH (ref 0.82–1.77)
TSH: 0.612 u[IU]/mL (ref 0.450–4.500)

## 2020-12-15 ENCOUNTER — Other Ambulatory Visit: Payer: Self-pay

## 2020-12-15 DIAGNOSIS — E039 Hypothyroidism, unspecified: Secondary | ICD-10-CM

## 2021-01-11 ENCOUNTER — Other Ambulatory Visit: Payer: 59

## 2021-01-11 ENCOUNTER — Other Ambulatory Visit: Payer: Self-pay

## 2021-01-11 DIAGNOSIS — E039 Hypothyroidism, unspecified: Secondary | ICD-10-CM

## 2021-01-12 LAB — TSH+FREE T4
Free T4: 2.47 ng/dL — ABNORMAL HIGH (ref 0.82–1.77)
TSH: 0.397 u[IU]/mL — ABNORMAL LOW (ref 0.450–4.500)

## 2021-01-19 ENCOUNTER — Other Ambulatory Visit: Payer: Self-pay

## 2021-01-19 DIAGNOSIS — E039 Hypothyroidism, unspecified: Secondary | ICD-10-CM

## 2021-02-26 ENCOUNTER — Other Ambulatory Visit: Payer: Self-pay

## 2021-02-26 ENCOUNTER — Other Ambulatory Visit: Payer: 59

## 2021-02-26 DIAGNOSIS — E039 Hypothyroidism, unspecified: Secondary | ICD-10-CM

## 2021-02-27 LAB — TSH: TSH: 2.95 u[IU]/mL (ref 0.450–4.500)

## 2021-04-16 ENCOUNTER — Other Ambulatory Visit: Payer: Self-pay | Admitting: Family Medicine

## 2021-04-16 DIAGNOSIS — Z780 Asymptomatic menopausal state: Secondary | ICD-10-CM

## 2021-05-01 ENCOUNTER — Other Ambulatory Visit: Payer: Self-pay

## 2021-05-01 ENCOUNTER — Ambulatory Visit (INDEPENDENT_AMBULATORY_CARE_PROVIDER_SITE_OTHER): Payer: 59

## 2021-05-01 VITALS — BP 122/78 | HR 67 | Temp 97.7°F | Ht 59.0 in | Wt 206.6 lb

## 2021-05-01 DIAGNOSIS — Z23 Encounter for immunization: Secondary | ICD-10-CM

## 2021-05-01 NOTE — Progress Notes (Signed)
Pt presents today for flu shot 

## 2021-06-18 ENCOUNTER — Encounter: Payer: Self-pay | Admitting: Internal Medicine

## 2021-06-18 ENCOUNTER — Ambulatory Visit (INDEPENDENT_AMBULATORY_CARE_PROVIDER_SITE_OTHER): Payer: 59 | Admitting: Internal Medicine

## 2021-06-18 ENCOUNTER — Other Ambulatory Visit: Payer: Self-pay

## 2021-06-18 VITALS — BP 130/78 | HR 71 | Temp 98.5°F | Ht 59.0 in | Wt 208.6 lb

## 2021-06-18 DIAGNOSIS — E039 Hypothyroidism, unspecified: Secondary | ICD-10-CM | POA: Diagnosis not present

## 2021-06-18 DIAGNOSIS — D329 Benign neoplasm of meninges, unspecified: Secondary | ICD-10-CM

## 2021-06-18 DIAGNOSIS — R030 Elevated blood-pressure reading, without diagnosis of hypertension: Secondary | ICD-10-CM

## 2021-06-18 DIAGNOSIS — R7303 Prediabetes: Secondary | ICD-10-CM | POA: Diagnosis not present

## 2021-06-18 DIAGNOSIS — G4733 Obstructive sleep apnea (adult) (pediatric): Secondary | ICD-10-CM | POA: Diagnosis not present

## 2021-06-18 DIAGNOSIS — Z6841 Body Mass Index (BMI) 40.0 and over, adult: Secondary | ICD-10-CM

## 2021-06-18 NOTE — Progress Notes (Signed)
I,Tianna Badgett,acting as a Education administrator for [name], MD.,have documented all relevant documentation on the behalf of [name], MD,as directed by  [name], MD while in the presence of [name], MD.  This visit occurred during the SARS-CoV-2 public health emergency.  Safety protocols were in place, including screening questions prior to the visit, additional usage of staff PPE, and extensive cleaning of exam room while observing appropriate contact time as indicated for disinfecting solutions.  Subjective:     Patient ID: Carla Dixon , female    DOB: 05-May-1961 , 60 y.o.   MRN: [id number]   Chief Complaint  Patient presents with   Thyroid Problem    HPI  Patient is here today for thyroid follow up.  She reports compliance with meds. She is currently taking Synthroid 136mg daily M-F. She has not had any issues with the medication.   Thyroid Problem Presents for follow-up visit. Patient reports no cold intolerance, constipation, fatigue or palpitations. The symptoms have been stable.    Past Medical History:  Diagnosis Date   Graves' disease    Hypothyroidism    S/P thyroidectomy   Pericardial effusion    Pneumonia "several times"   Pulmonary aspiration of fluid ([facility]      Family History  Problem Relation Age of Onset   Hypertension Mother    Diabetes Mother    Gout Mother    Heart disease Father    Colon cancer Father    Heart Problems Father      Current Outpatient Medications:    SYNTHROID 125 MCG tablet, TAKE 1 TABLET BY MOUTH DAILY BEFORE BREAKFAST. (Patient taking differently: 1 tab po Monday - Friday), Disp: 90 tablet, Rfl: 1   valACYclovir (VALTREX) 500 MG tablet, Take 500 mg by mouth 2 (two) times daily., Disp: , Rfl:    Allergies  Allergen Reactions   Neosporin Original [Bacitracin-Neomycin-Polymyxin] Rash     Review of Systems  Constitutional: Negative.  Negative for fatigue.  Respiratory: Negative.    Cardiovascular: Negative.   Negative for palpitations.  Gastrointestinal: Negative.  Negative for constipation.  Endocrine: Negative for cold intolerance.  Neurological: Negative.     Today's Vitals   06/18/21 1118  BP: 130/78  Pulse: 71  Temp: 98.5 F (36.9 C)  TempSrc: Oral  Weight: 208 lb 9.6 oz (94.6 kg)  Height: _0  (1.499 m)   Body mass index is 42.13 kg/m.  Wt Readings from Last 3 Encounters:  06/18/21 208 lb 9.6 oz (94.6 kg)  05/01/21 206 lb 9.6 oz (93.7 kg)  12/13/20 207 lb 3.2 oz (94 kg)    Objective:  Physical Exam Vitals and nursing note reviewed.  Constitutional:      Appearance: Normal appearance. She is obese.  HENT:     Head: Normocephalic and atraumatic.     Nose:     Comments: Masked     Mouth/Throat:     Comments: Masked  Eyes:     Extraocular Movements: Extraocular movements intact.  Cardiovascular:     Rate and Rhythm: Normal rate and regular rhythm.     Heart sounds: Normal heart sounds.  Pulmonary:     Effort: Pulmonary effort is normal.     Breath sounds: Normal breath sounds.  Musculoskeletal:     Cervical back: Normal range of motion.  Skin:    General: Skin is warm.  Neurological:     General: No focal deficit present.     Mental Status: She  is alert.  Psychiatric:        Mood and Affect: Mood normal.        Behavior: Behavior normal.        Assessment And Plan:     1. Primary hypothyroidism Comments: Chronic, I will check thyroid panel today. I will adjust meds as needed.  She will f/u in six months for re-evaluation.  - TSH + free T4  2. Prediabetes Comments: Her a1c has been elevated in the past. I will recheck this today. Advised to limit intake of sweetened beverages and refined carbs. - Hemoglobin A1c - Insulin, random(561) - BMP8+EGFR  3. Elevated blood pressure reading Comments: PT advised goal BP <130/80 .Advised to follow low sodium diet and to incorporate more exercise into her daily routine.   4. OSA (obstructive sleep  apnea) Comments: Chronic, encouraged to wear CPAP at least four hours/night. Unfortunately, she is still waiting to hear from Neuro regarding a new mask .  5. Meningioma ([place]) Comments: Chronic, now followed by Neuro. MRI results reviewed.   6. Class 3 severe obesity due to excess calories without serious comorbidity with body mass index (BMI) of 40.0 to 44.9 in adult [facility]) She is encouraged to initially strive for BMI less than 35 to decrease cardiac risk. Advised to aim for at least 150 minutes of exercise per week.   Patient was given opportunity to ask questions. Patient verbalized understanding of the plan and was able to repeat key elements of the plan. All questions were answered to their satisfaction.   I, [name], MD, have reviewed all documentation for this visit. The documentation on 07/01/21 for the exam, diagnosis, procedures, and orders are all accurate and complete.   IF YOU HAVE BEEN REFERRED TO A SPECIALIST, IT MAY TAKE 1-2 WEEKS TO SCHEDULE/PROCESS THE REFERRAL. IF YOU HAVE NOT HEARD FROM US/SPECIALIST IN TWO WEEKS, PLEASE GIVE [place] A CALL AT [phone].   THE PATIENT IS ENCOURAGED TO PRACTICE SOCIAL DISTANCING DUE TO THE COVID-19 PANDEMIC.

## 2021-06-18 NOTE — Patient Instructions (Signed)
Triiodothyronine Test Why am I having this test? The triiodothyronine test is used to evaluate thyroid function. The thyroid is a gland in the lower front of your neck. It makes hormones that affect many body parts and systems, including the system that affects how quickly your body burns fuel for energy (metabolism). You may have this test: To monitor treatment for a thyroid disorder. To help diagnose various thyroid conditions, including: An overactive thyroid (hyperthyroidism). An underactive thyroid (hypothyroidism). Inflammation of the thyroid (thyroiditis). What is being tested? This test measures the level of triiodothyronine (T3) in the blood. T3 is one of the two main hormones made by the thyroid gland. This test is often done with thyroid tests that test other thyroid hormones. What kind of sample is taken? A blood sample is required for this test. It is usually collected by inserting a needle into a blood vessel. How do I prepare for this test? Follow instructions from your health care provider about changing or stopping your regular medicines. Many medicines can affect thyroid hormones, including birth control pills, estrogen, and aspirin. Tell a health care provider about: Any allergies you have. All medicines you are taking, including vitamins, herbs, eye drops, creams, and over-the-counter medicines. Any blood disorders you have. Any surgeries you have had. Any medical conditions you have. Whether you are pregnant or may be pregnant. How are the results reported? Your test results will be reported as a value that indicates how much T3 is in your blood. This will be given as nanograms of T3 per deciliter of blood (ng/dL). Your health care provider will compare your results to normal ranges that were established after testing a large group of people (reference ranges). Reference ranges may vary among labs and hospitals. For this test, common reference ranges are: Reference ranges  for T3 vary by age. Common reference ranges are: 58-44 days old: 100-740 ng/dL. 1-11 months old: 105-245 ng/dL. 37-2 years old: 105-270 ng/dL. 5-40 years old: 95-240 ng/dL. 70-50 years old: 80-215 ng/dL. 60-55 years old: 80-210 ng/dL. 20-37 years old: 70-205 ng/dL. Older than 50 years: 40-180 ng/dL. What do the results mean? A result that is within your reference range is considered normal. This means that you have a normal amount of T3 in your blood. A result that is higher than your reference range means that you have more T3 in your blood than normal. This may mean that: You have hyperthyroidism. You have thyroiditis. You are taking more thyroid medicine than you need. You are pregnant. You have hepatitis. A result that is lower than your reference range means that you have less T3 in your blood than normal. This may mean that you have: Hypothyroidism. Problems with your pituitary gland function. Problems with your hypothalamus gland function. A lack of certain nutrients in your diet (malnutrition). Liver disease. Kidney disease. Talk with your health care provider about what your results mean. Questions to ask your health care provider Ask your health care provider, or the department that is doing the test: When will my results be ready? How will I get my results? What are my treatment options? What other tests do I need? What are my next steps? Summary The thyroid is a gland in the lower front of the neck. It makes hormones that affect many body parts and systems, including the system that affects how quickly your body burns fuel for energy (metabolism). The triiodothyronine test is used to evaluate thyroid function. It measures the level of triiodothyronine (T3) in your  blood. T3 is one of the two main hormones made by the thyroid gland. Follow instructions from your health care provider about changing or stopping your regular medicines. Many medicines can affect thyroid  hormones, including birth control pills, estrogen, and aspirin. Talk with your health care provider about what your test results mean. This information is not intended to replace advice given to you by your health care provider. Make sure you discuss any questions you have with your health care provider. Document Revised: 03/15/2021 Document Reviewed: 03/23/2020 Elsevier Patient Education  2022 [employer].

## 2021-06-19 LAB — BMP8+EGFR
BUN/Creatinine Ratio: 13 (ref 12–28)
BUN: 9 mg/dL (ref 8–27)
CO2: 25 mmol/L (ref 20–29)
Calcium: 9.8 mg/dL (ref 8.7–10.3)
Chloride: 98 mmol/L (ref 96–106)
Creatinine, Ser: 0.71 mg/dL (ref 0.57–1.00)
Glucose: 89 mg/dL (ref 70–99)
Potassium: 4.4 mmol/L (ref 3.5–5.2)
Sodium: 141 mmol/L (ref 134–144)
eGFR: 97 mL/min/{1.73_m2} (ref >59)

## 2021-06-19 LAB — HEMOGLOBIN A1C
Est. average glucose Bld gHb Est-mCnc: 126 mg/dL
Hgb A1c MFr Bld: 6 % — ABNORMAL HIGH (ref 4.8–5.6)

## 2021-06-19 LAB — INSULIN, RANDOM: INSULIN: 14.8 u[IU]/mL (ref 2.6–24.9)

## 2021-06-19 LAB — TSH+FREE T4
Free T4: 1.42 ng/dL (ref 0.82–1.77)
TSH: 10.3 u[IU]/mL — ABNORMAL HIGH (ref 0.450–4.500)

## 2021-06-21 ENCOUNTER — Other Ambulatory Visit: Payer: Self-pay

## 2021-06-21 DIAGNOSIS — E039 Hypothyroidism, unspecified: Secondary | ICD-10-CM

## 2021-08-09 ENCOUNTER — Other Ambulatory Visit: Payer: 59

## 2021-08-09 ENCOUNTER — Other Ambulatory Visit: Payer: Self-pay

## 2021-08-09 DIAGNOSIS — Z780 Asymptomatic menopausal state: Secondary | ICD-10-CM

## 2021-08-09 DIAGNOSIS — E039 Hypothyroidism, unspecified: Secondary | ICD-10-CM

## 2021-08-10 LAB — TSH: TSH: 2.3 u[IU]/mL (ref 0.450–4.500)

## 2021-09-25 ENCOUNTER — Other Ambulatory Visit: Payer: Self-pay | Admitting: Internal Medicine

## 2021-12-20 ENCOUNTER — Ambulatory Visit (INDEPENDENT_AMBULATORY_CARE_PROVIDER_SITE_OTHER): Payer: 59 | Admitting: Internal Medicine

## 2021-12-20 ENCOUNTER — Encounter: Payer: Self-pay | Admitting: Internal Medicine

## 2021-12-20 VITALS — BP 116/80 | HR 72 | Temp 98.0°F | Ht 59.0 in | Wt 220.4 lb

## 2021-12-20 DIAGNOSIS — E039 Hypothyroidism, unspecified: Secondary | ICD-10-CM

## 2021-12-20 DIAGNOSIS — D329 Benign neoplasm of meninges, unspecified: Secondary | ICD-10-CM | POA: Diagnosis not present

## 2021-12-20 DIAGNOSIS — Z2821 Immunization not carried out because of patient refusal: Secondary | ICD-10-CM

## 2021-12-20 DIAGNOSIS — Z6841 Body Mass Index (BMI) 40.0 and over, adult: Secondary | ICD-10-CM | POA: Diagnosis not present

## 2021-12-20 DIAGNOSIS — Z0000 Encounter for general adult medical examination without abnormal findings: Secondary | ICD-10-CM

## 2021-12-20 DIAGNOSIS — R7303 Prediabetes: Secondary | ICD-10-CM

## 2021-12-20 NOTE — Progress Notes (Unsigned)
Carla Dixon,acting as a Education administrator for Carla Greenland, MD.,have documented all relevant documentation on the behalf of Carla Greenland, MD,as directed by  Carla Greenland, MD while in the presence of Carla Greenland, MD.  This visit occurred during the SARS-CoV-2 public health emergency.  Safety protocols were in place, including screening questions prior to the visit, additional usage of staff PPE, and extensive cleaning of exam room while observing appropriate contact time as indicated for disinfecting solutions.  Subjective:     Patient ID: Carla Dixon , female    DOB: 02-24-1961 , 61 y.o.   MRN: [id number]   Chief Complaint  Patient presents with   Annual Exam    HPI  She is here today for a full physical exam. She is followed by Dr. Mancel Bale for her Gyn care. She reports compliance with meds. She is also seen at [facility], she does not disclose the type of care she receives.   Thyroid Problem Presents for follow-up visit. Patient reports no constipation, leg swelling or menstrual problem.    Past Medical History:  Diagnosis Date   Graves' disease    Hypothyroidism    S/P thyroidectomy   Pericardial effusion    Pneumonia "several times"   Pulmonary aspiration of fluid ([place])      Family History  Problem Relation Age of Onset   Hypertension Mother    Diabetes Mother    Gout Mother    Heart disease Father    Colon cancer Father    Heart Problems Father      Current Outpatient Medications:    SYNTHROID 125 MCG tablet, 1 tab po Monday - Friday (Patient taking differently: 1 tab po Monday - saturday), Disp: 90 tablet, Rfl: 1   valACYclovir (VALTREX) 500 MG tablet, Take 500 mg by mouth 2 (two) times daily., Disp: , Rfl:    Allergies  Allergen Reactions   Neosporin Original [Bacitracin-Neomycin-Polymyxin] Rash      The patient states she uses post menopausal status for birth control. Last LMP was No LMP recorded (lmp unknown). Patient is  postmenopausal.. Negative for Dysmenorrhea. Negative for: breast discharge, breast lump(s), breast pain and breast self exam. Associated symptoms include abnormal vaginal bleeding. Pertinent negatives include abnormal bleeding (hematology), anxiety, decreased libido, depression, difficulty falling sleep, dyspareunia, history of infertility, nocturia, sexual dysfunction, sleep disturbances, urinary incontinence, urinary urgency, vaginal discharge and vaginal itching. Diet regular.The patient states her exercise level is    . The patient's tobacco use is:  Social History   Tobacco Use  Smoking Status Never  Smokeless Tobacco Never  . She has been exposed to passive smoke. The patient's alcohol use is:  Social History   Substance and Sexual Activity  Alcohol Use No    Review of Systems  Constitutional: Negative.   HENT: Negative.    Eyes: Negative.   Respiratory: Negative.    Cardiovascular: Negative.   Gastrointestinal: Negative.  Negative for constipation.  Endocrine: Negative.   Genitourinary: Negative.  Negative for menstrual problem.  Musculoskeletal: Negative.   Skin: Negative.   Allergic/Immunologic: Negative.   Neurological: Negative.   Hematological: Negative.   Psychiatric/Behavioral: Negative.      Today's Vitals   12/20/21 1039  BP: 116/80  Pulse: 72  Temp: 98 F (36.7 C)  Weight: 220 lb 6.4 oz (100 kg)  Height: '4\' 11"'$  (1.499 m)  PainSc: 0-No pain   Body mass index is 44.52 kg/m.  Wt Readings from Last 3 Encounters:  12/20/21 220 lb 6.4 oz (100 kg)  06/18/21 208 lb 9.6 oz (94.6 kg)  05/01/21 206 lb 9.6 oz (93.7 kg)     Objective:  Physical Exam Chest:  Breasts:    Tanner Score is 5.     Comments: She wore her bra for exam, breast exam not performed Skin:    Findings: No bruising.     Comments: Irregular shaped  hyperpigmented lesion near right temporal lesion        Assessment And Plan:     1. Encounter for general adult medical examination w/o  abnormal findings  2. Primary hypothyroidism  3. Prediabetes  4. Class 3 severe obesity due to excess calories without serious comorbidity with body mass index (BMI) of 40.0 to 44.9 in adult ([place])  5. Herpes zoster vaccination declined     Patient was given opportunity to ask questions. Patient verbalized understanding of the plan and was able to repeat key elements of the plan. All questions were answered to their satisfaction.   Carla Greenland, MD   I, Carla Greenland, MD, have reviewed all documentation for this visit. The documentation on 12/20/21 for the exam, diagnosis, procedures, and orders are all accurate and complete.  THE PATIENT IS ENCOURAGED TO PRACTICE SOCIAL DISTANCING DUE TO THE COVID-19 PANDEMIC.

## 2021-12-20 NOTE — Patient Instructions (Signed)

## 2021-12-21 LAB — CMP14+EGFR
ALT: 19 IU/L (ref 0–32)
AST: 20 IU/L (ref 0–40)
Albumin/Globulin Ratio: 1.7 (ref 1.2–2.2)
Albumin: 4.7 g/dL (ref 3.8–4.8)
Alkaline Phosphatase: 77 IU/L (ref 44–121)
BUN/Creatinine Ratio: 13 (ref 12–28)
BUN: 10 mg/dL (ref 8–27)
Bilirubin Total: 0.5 mg/dL (ref 0.0–1.2)
CO2: 23 mmol/L (ref 20–29)
Calcium: 9.7 mg/dL (ref 8.7–10.3)
Chloride: 97 mmol/L (ref 96–106)
Creatinine, Ser: 0.79 mg/dL (ref 0.57–1.00)
Globulin, Total: 2.8 g/dL (ref 1.5–4.5)
Glucose: 99 mg/dL (ref 70–99)
Potassium: 4 mmol/L (ref 3.5–5.2)
Sodium: 139 mmol/L (ref 134–144)
Total Protein: 7.5 g/dL (ref 6.0–8.5)
eGFR: 85 mL/min/{1.73_m2} (ref >59)

## 2021-12-21 LAB — LIPID PANEL
Chol/HDL Ratio: 3 ratio (ref 0.0–4.4)
Cholesterol, Total: 187 mg/dL (ref 100–199)
HDL: 63 mg/dL (ref >39)
LDL Chol Calc (NIH): 111 mg/dL — ABNORMAL HIGH (ref 0–99)
Triglycerides: 70 mg/dL (ref 0–149)
VLDL Cholesterol Cal: 13 mg/dL (ref 5–40)

## 2021-12-21 LAB — CBC
Hematocrit: 39.8 % (ref 34.0–46.6)
Hemoglobin: 13.5 g/dL (ref 11.1–15.9)
MCH: 29 pg (ref 26.6–33.0)
MCHC: 33.9 g/dL (ref 31.5–35.7)
MCV: 86 fL (ref 79–97)
Platelets: 402 10*3/uL (ref 150–450)
RBC: 4.65 x10E6/uL (ref 3.77–5.28)
RDW: 14.2 % (ref 11.7–15.4)
WBC: 4.4 10*3/uL (ref 3.4–10.8)

## 2021-12-21 LAB — TSH+FREE T4
Free T4: 1.96 ng/dL — ABNORMAL HIGH (ref 0.82–1.77)
TSH: 2.44 u[IU]/mL (ref 0.450–4.500)

## 2021-12-21 LAB — HEMOGLOBIN A1C
Est. average glucose Bld gHb Est-mCnc: 123 mg/dL
Hgb A1c MFr Bld: 5.9 % — ABNORMAL HIGH (ref 4.8–5.6)

## 2022-03-28 ENCOUNTER — Other Ambulatory Visit: Payer: Self-pay | Admitting: Internal Medicine

## 2022-06-19 LAB — HM MAMMOGRAPHY

## 2022-06-19 LAB — HM PAP SMEAR: HM Pap smear: NORMAL

## 2022-06-24 ENCOUNTER — Encounter: Payer: Self-pay | Admitting: Internal Medicine

## 2022-06-24 ENCOUNTER — Ambulatory Visit: Payer: 59 | Admitting: Internal Medicine

## 2022-06-24 VITALS — BP 130/80 | HR 78 | Temp 97.9°F | Ht 59.0 in | Wt 222.0 lb

## 2022-06-24 DIAGNOSIS — E039 Hypothyroidism, unspecified: Secondary | ICD-10-CM | POA: Diagnosis not present

## 2022-06-24 DIAGNOSIS — Z6841 Body Mass Index (BMI) 40.0 and over, adult: Secondary | ICD-10-CM

## 2022-06-24 DIAGNOSIS — Z23 Encounter for immunization: Secondary | ICD-10-CM

## 2022-06-24 DIAGNOSIS — R7309 Other abnormal glucose: Secondary | ICD-10-CM

## 2022-06-24 NOTE — Patient Instructions (Signed)

## 2022-06-24 NOTE — Progress Notes (Signed)
Carla Dixon,acting as a Education administrator for Carla Greenland, MD.,have documented all relevant documentation on the behalf of Carla Greenland, MD,as directed by  Carla Greenland, MD while in the presence of Carla Greenland, MD.   Subjective:     Patient ID: Carla Dixon , female    DOB: 11-18-1960 , 61 y.o.   MRN: [id number]   Chief Complaint  Patient presents with   Hypothyroidism    HPI  Patient is here today for thyroid follow up.  She reports compliance with meds. Patient has no other concerns today. She recently had labs drawn by Gyn, Dr. Mancel Bale. She does not have results with her today.   BP Readings from Last 3 Encounters: 06/24/22 : 130/80 12/20/21 : 116/80 06/18/21 : 130/78    Thyroid Problem Presents for follow-up visit. Patient reports no cold intolerance, nail problem or palpitations. The symptoms have been stable.     Past Medical History:  Diagnosis Date   Graves' disease    Hypothyroidism    S/P thyroidectomy   Pericardial effusion    Pneumonia "several times"   Pulmonary aspiration of fluid ([place])      Family History  Problem Relation Age of Onset   Hypertension Mother    Diabetes Mother    Gout Mother    Heart disease Father    Colon cancer Father    Heart Problems Father      Current Outpatient Medications:    SYNTHROID 125 MCG tablet, 1 tab po Monday - saturday, Disp: 90 tablet, Rfl: 1   valACYclovir (VALTREX) 500 MG tablet, Take 500 mg by mouth 2 (two) times daily., Disp: , Rfl:    Allergies  Allergen Reactions   Neosporin Original [Bacitracin-Neomycin-Polymyxin] Rash     Review of Systems  Constitutional: Negative.   Eyes: Negative.   Respiratory: Negative.    Cardiovascular: Negative.  Negative for palpitations.  Gastrointestinal: Negative.   Endocrine: Negative for cold intolerance.  Musculoskeletal: Negative.   Skin: Negative.   Neurological: Negative.   Psychiatric/Behavioral: Negative.       Today's Vitals   06/24/22 1057   BP: 130/80  Pulse: 78  Temp: 97.9 F (36.6 C)  TempSrc: Oral  Weight: 222 lb (100.7 kg)  Height: '4\' 11"'$  (1.499 m)  PainSc: 0-No pain   Body mass index is 44.84 kg/m.  Wt Readings from Last 3 Encounters:  06/24/22 222 lb (100.7 kg)  12/20/21 220 lb 6.4 oz (100 kg)  06/18/21 208 lb 9.6 oz (94.6 kg)    Objective:  Physical Exam Vitals and nursing note reviewed.  Constitutional:      Appearance: Normal appearance.  HENT:     Head: Normocephalic and atraumatic.     Nose:     Comments: Masked     Mouth/Throat:     Comments: Masked  Eyes:     Extraocular Movements: Extraocular movements intact.  Cardiovascular:     Rate and Rhythm: Normal rate and regular rhythm.     Heart sounds: Normal heart sounds.  Pulmonary:     Effort: Pulmonary effort is normal.     Breath sounds: Normal breath sounds.  Musculoskeletal:     Cervical back: Normal range of motion.  Skin:    General: Skin is warm.  Neurological:     General: No focal deficit present.     Mental Status: She is alert.  Psychiatric:        Mood and Affect: Mood normal.  Behavior: Behavior normal.         Assessment And Plan:     1. Primary hypothyroidism Comments: Chronic, i will request records from Dr. Mancel Bale and abstract her labs once available. She will f/u in six months for re-evaluation.  2. BMI 40.0-44.9, adult (HCC) Comments: BMI 44. She agrees to [employer] referral. She is encouraged to aim for at least 150 minutes fo exercise per week. - Amb Referral To Provider Referral Exercise Program (P.R.E.P)  3. Need for influenza vaccination - Flu Vaccine QUAD 6+ mos PF IM (Fluarix Quad PF)   Patient was given opportunity to ask questions. Patient verbalized understanding of the plan and was able to repeat key elements of the plan. All questions were answered to their satisfaction.   I, Carla Greenland, MD, have reviewed all documentation for this visit. The documentation on 06/24/22 for the exam,  diagnosis, procedures, and orders are all accurate and complete.   IF YOU HAVE BEEN REFERRED TO A SPECIALIST, IT MAY TAKE 1-2 WEEKS TO SCHEDULE/PROCESS THE REFERRAL. IF YOU HAVE NOT HEARD FROM US/SPECIALIST IN TWO WEEKS, PLEASE GIVE [place] A CALL AT [phone].   THE PATIENT IS ENCOURAGED TO PRACTICE SOCIAL DISTANCING DUE TO THE COVID-19 PANDEMIC.

## 2022-06-26 ENCOUNTER — Telehealth: Payer: Self-pay | Admitting: *Deleted

## 2022-06-26 NOTE — Telephone Encounter (Signed)
Contacted in regards to PREP Class referral. Left voice message for return call or more information.

## 2022-06-26 NOTE — Telephone Encounter (Signed)
Patient returned call regarding PREP Class referral. Interested in participating. We will call back with Jan/Feb 2024 class availability.

## 2022-09-07 IMAGING — MR MR HEAD WO/W CM
11 of 13 series · 30 of 48 positions shown · IV contrast (multihance)
Comparison: None.

CLINICAL DATA: New onset headaches.  History of meningioma.

EXAM:
MRI HEAD WITHOUT AND WITH CONTRAST
TECHNIQUE: Multiplanar, multiecho pulse sequences of the brain and surrounding
structures were obtained without and with intravenous contrast.
CONTRAST:  20mL MULTIHANCE GADOBENATE DIMEGLUMINE 529 MG/ML IV SOLN

[Series 3: T1 · sagittal · 5.0mm · 0.45mm/px · 1 of 21 slices shown (1 of 2)]
[im 1/21]
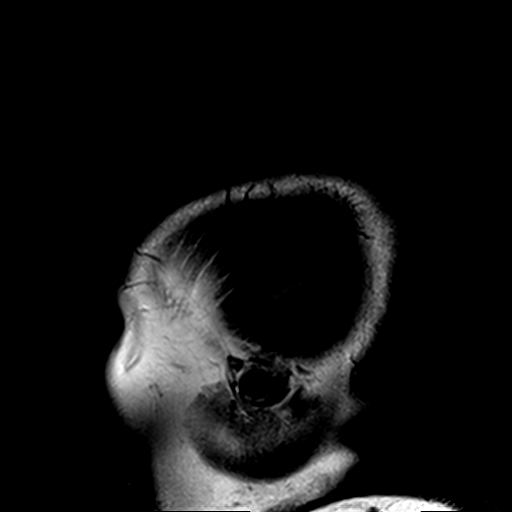

[Series 4: DWI · axial · 5.0mm · 1.80mm/px · z∈[-71,+78]mm · 4 of 45 slices shown (1 of 2)]
[im 1/45]
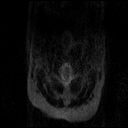
[im 15/45]
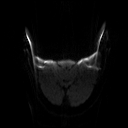
[im 30/45]
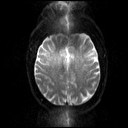
[im 45/45]
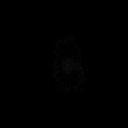

[Series 5: DWI · axial · 5.0mm · 1.80mm/px · z∈[-71,+78]mm · 3 of 24 slices shown (2 of 2)]
[im 1/24]
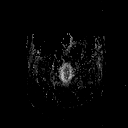
[im 12/24]
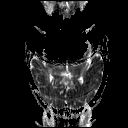
[im 24/24]
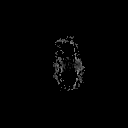

[Series 6: T2 · axial · 5.0mm · 0.51mm/px · z∈[-65,+84]mm · 3 of 24 slices shown (1 of 2)]
[im 1/24]
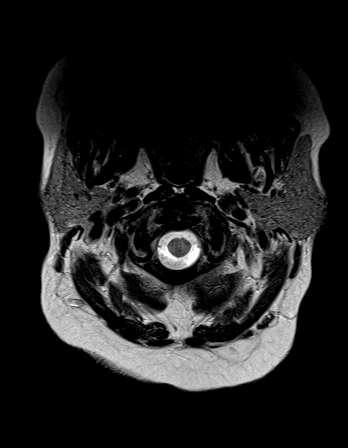
[im 12/24]
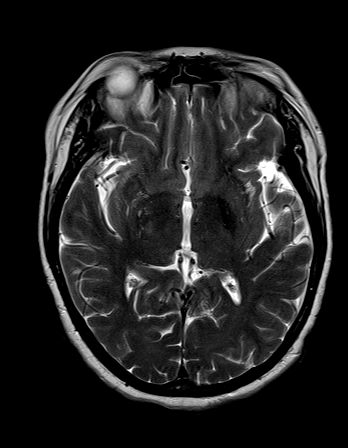
[im 24/24]
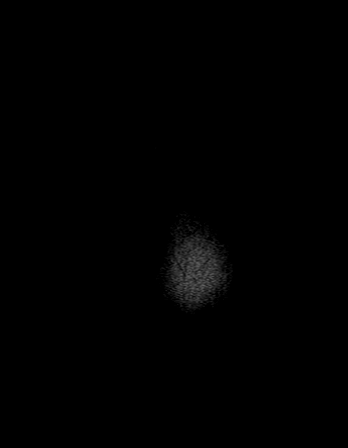

[Series 7: axial grad (blood) · axial · 5.0mm · 0.45mm/px · z∈[-62,+80]mm · 3 of 23 slices shown]
[im 1/23]
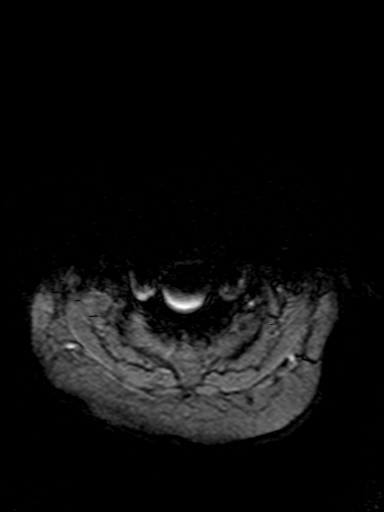
[im 12/23]
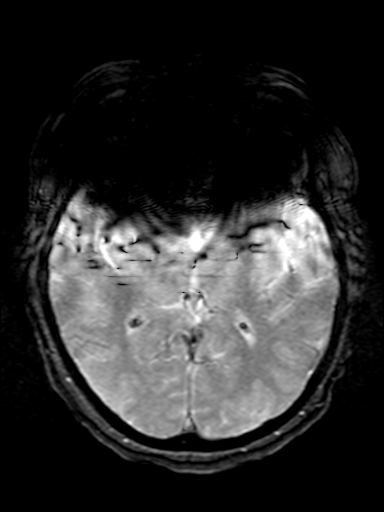
[im 23/23]
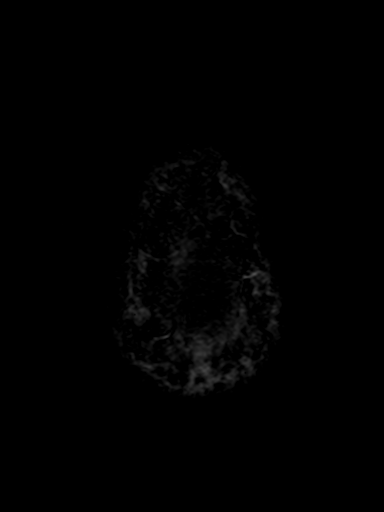

[Series 8: FLAIR · axial · 5.0mm · 0.45mm/px · z∈[-64,+85]mm · 3 of 24 slices shown]
[im 1/24]
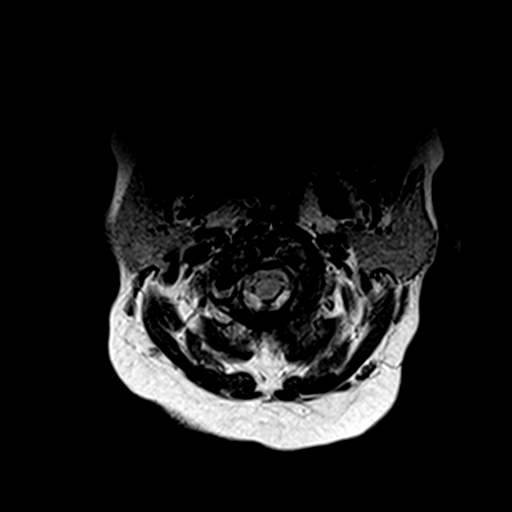
[im 12/24]
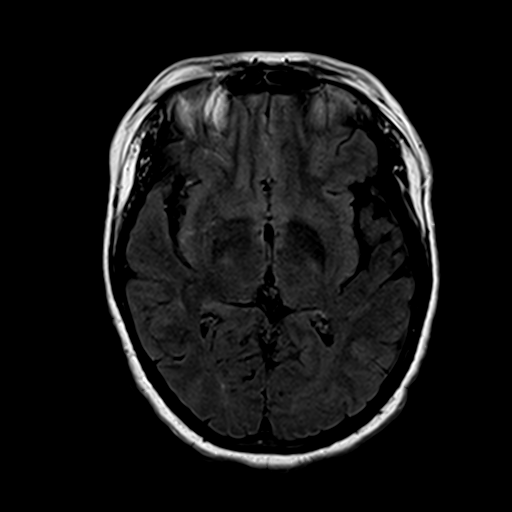
[im 24/24]
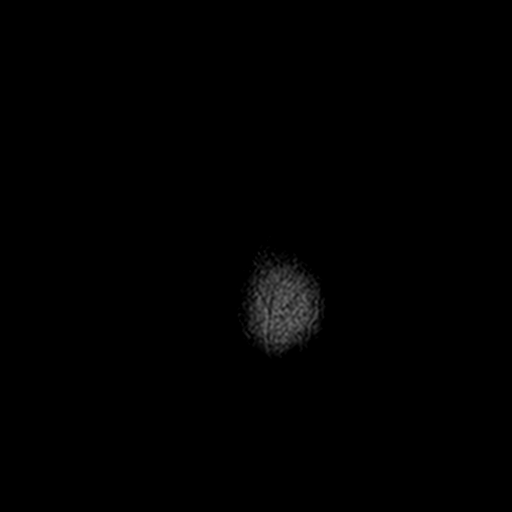

[Series 9: t1_mpr_tra · axial · 2.0mm · 0.45mm/px · z∈[-61,-41]mm · 2 of 72 slices shown]
[im 1/72]
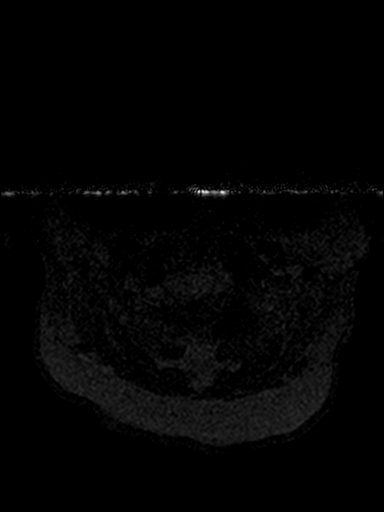
[im 11/72]
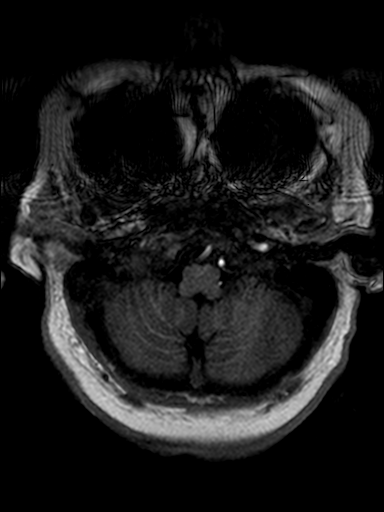

[Series 10: T2 · coronal · 5.0mm · 0.45mm/px · 3 of 28 slices shown (2 of 2)]
[im 1/28]
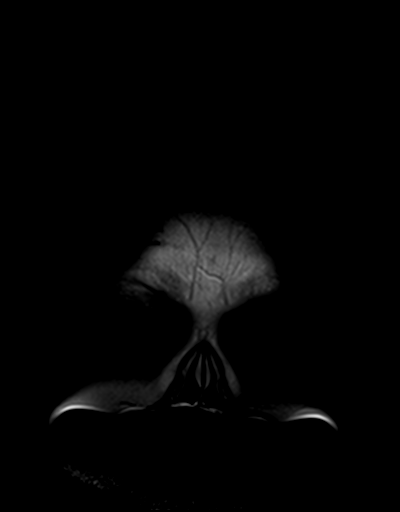
[im 14/28]
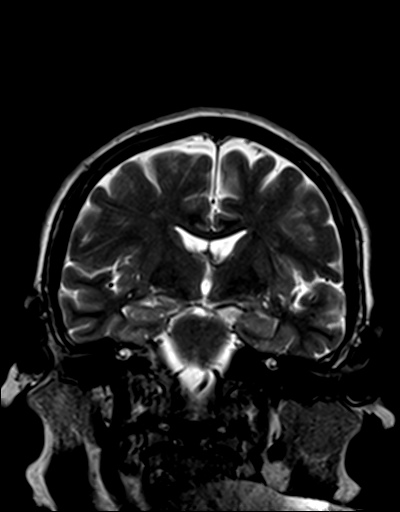
[im 28/28]
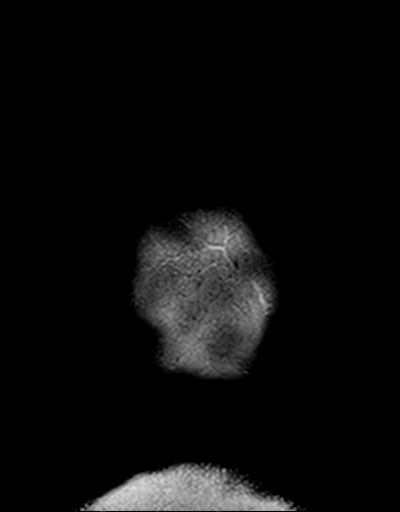

[Series 11: T1 · axial · 5.0mm · 0.45mm/px · z∈[-65,+90]mm · 3 of 25 slices shown (2 of 2)]
[im 1/25]
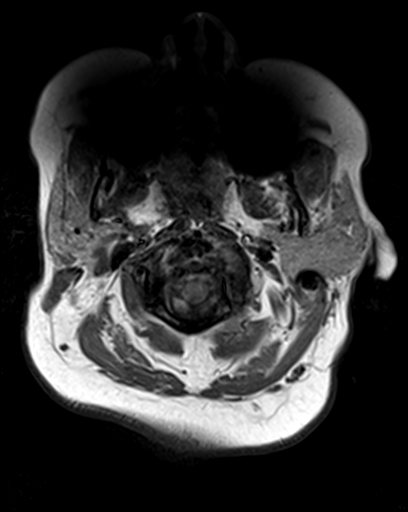
[im 13/25]
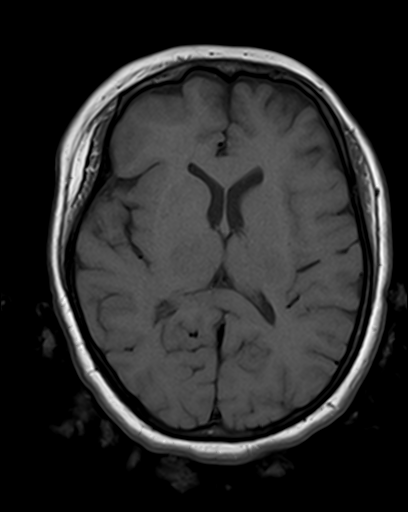
[im 25/25]
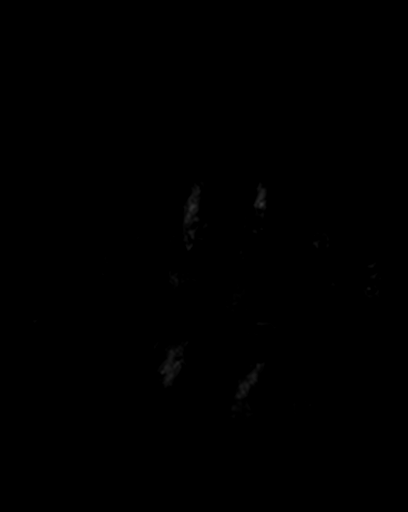

[Series 13: T1 post-contrast · axial · 5.0mm · 0.45mm/px · z∈[-52,+78]mm · 2 of 21 slices shown (1 of 2)]
[im 1/21]
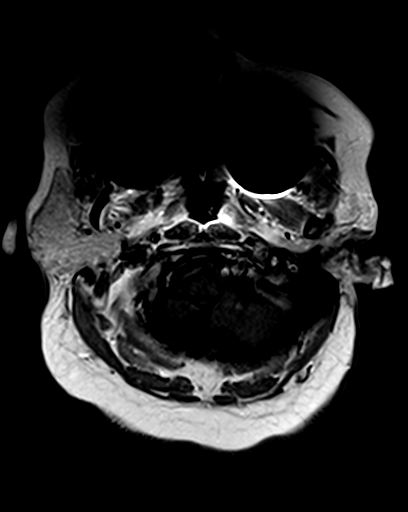
[im 21/21]
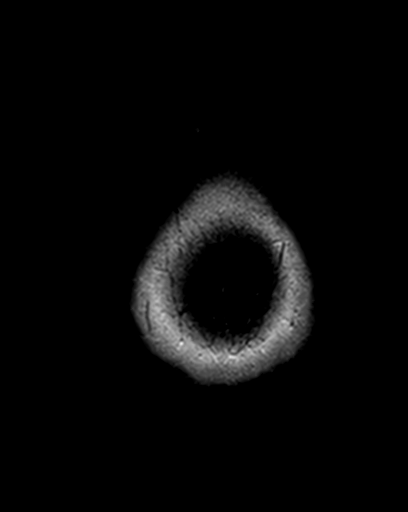

[Series 14: T1 post-contrast · coronal · 5.0mm · 0.45mm/px · 3 of 28 slices shown (2 of 2)]
[im 1/28]
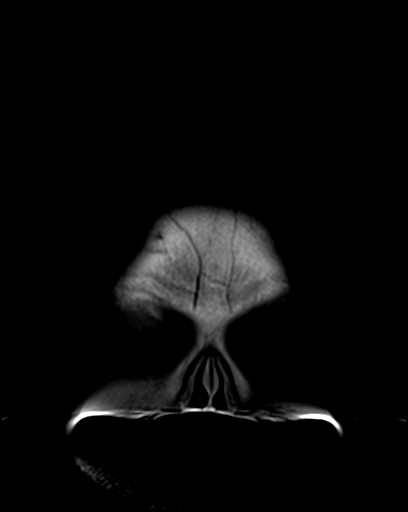
[im 14/28]
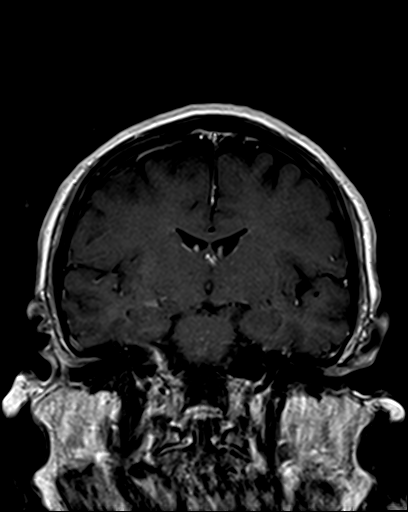
[im 28/28]
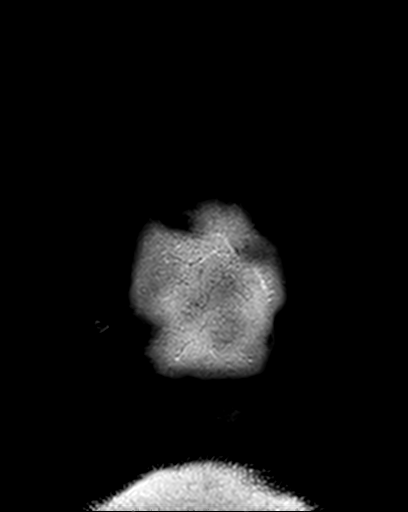

[30 of 48 positions shown; findings below may reference images not displayed]

FINDINGS: Brain: There is extensive magnetic susceptibility artifact from
orthodontic braces. This obscures much of the anterior cerebral
hemispheres and posterior fossa on diffusion weighted and T2*
gradient echo imaging. No acute infarct or intracranial hemorrhage
is identified within this limitation. The ventricles and sulci are
normal in size. No brain parenchymal signal abnormality is
identified.

There is an avidly enhancing extra-axial mass laterally in the
posterior fossa on the left which abuts the undersurface of the
tentorium and measures 1.6 x 1.7 x 1.8 cm. The mass narrows the
junction of the left transverse and sigmoid sinuses which remain
patent. The mass also focally deforms the left cerebellar hemisphere
without edema.

Vascular: Major intracranial vascular flow voids are preserved.

Skull and upper cervical spine: Unremarkable bone marrow signal.

Sinuses/Orbits: Unremarkable orbits. Mild bilateral ethmoid sinus
mucosal thickening. Small right mastoid effusion.

Other: None.
IMPRESSION: 1. 1.8 cm posterior fossa meningioma. Mild local mass effect without
edema.
2. Otherwise unremarkable appearance of the brain within limitations
of artifact from braces.

## 2022-09-29 ENCOUNTER — Other Ambulatory Visit: Payer: Self-pay | Admitting: Internal Medicine

## 2022-09-30 ENCOUNTER — Ambulatory Visit (INDEPENDENT_AMBULATORY_CARE_PROVIDER_SITE_OTHER): Payer: 59 | Admitting: Family Medicine

## 2022-09-30 ENCOUNTER — Encounter (INDEPENDENT_AMBULATORY_CARE_PROVIDER_SITE_OTHER): Payer: Self-pay | Admitting: Family Medicine

## 2022-09-30 VITALS — BP 140/84 | HR 78 | Temp 98.3°F | Ht 59.0 in | Wt 219.0 lb

## 2022-09-30 DIAGNOSIS — R7303 Prediabetes: Secondary | ICD-10-CM | POA: Diagnosis not present

## 2022-09-30 DIAGNOSIS — E890 Postprocedural hypothyroidism: Secondary | ICD-10-CM | POA: Diagnosis not present

## 2022-09-30 DIAGNOSIS — Z6841 Body Mass Index (BMI) 40.0 and over, adult: Secondary | ICD-10-CM | POA: Diagnosis not present

## 2022-09-30 DIAGNOSIS — Z0289 Encounter for other administrative examinations: Secondary | ICD-10-CM

## 2022-09-30 NOTE — Assessment & Plan Note (Signed)
Lab Results  Component Value Date   HGBA1C 5.9 (H) 12/20/2021   Has never taken metformin. Consuming excess starches with a lack of regular exercise  Check fasting labs next visit.  Plan to reduce intake of added sugar and given info on [place] [facility] for punch card program.  Consider metformin or GLP- 1 receptor agonist.

## 2022-09-30 NOTE — Progress Notes (Signed)
Office: [phone]  /  Fax: [phone]   Initial Visit  Carla Dixon was seen in clinic today to evaluate for obesity. She is interested in losing weight to improve overall health and reduce the risk of weight related complications. She presents today to review program treatment options, initial physical assessment, and evaluation.     She was referred by: PCP  When asked what else they would like to accomplish? She states: Adopt healthier eating patterns, Improve quality of life, and Improve appearance  Weight history: weight has slowly increased since thyroidectomy in 2017.    When asked how has your weight affected you? She states: Having poor endurance  Some associated conditions: None  Contributing factors: Family history, Reduced physical activity, Menopause, and Other: thyroidectomy moved from [place]- less walking  Weight promoting medications identified: None  Current nutrition plan: None no pork, little beef  Current level of physical activity: Walking; not using gym membership  Current or previous pharmacotherapy: Phentermine  Response to medication: Lost weight initially but was unable to sustain weight loss   Past medical history includes:   Past Medical History:  Diagnosis Date   Graves' disease    Hypothyroidism    S/P thyroidectomy   Pericardial effusion    Pneumonia "several times"   Pulmonary aspiration of fluid (HCC)      Objective:   BP (!) 140/84   Pulse 78   Temp 98.3 F (36.8 C)   Ht '4\' 11"'$  (1.499 m)   Wt 219 lb (99.3 kg)   LMP  (LMP Unknown)   SpO2 98%   BMI 44.23 kg/m  She was weighed on the bioimpedance scale: Body mass index is 44.23 kg/m.  Peak Weight:219 , Body Fat%:52.9, Visceral Fat Rating:19, Weight trend over the last 12 months: Increasing  General:  Alert, oriented and cooperative. Patient is in no acute distress.  Respiratory: Normal respiratory effort, no problems with respiration noted   Gait: able to ambulate  independently  Mental Status: Normal mood and affect. Normal behavior. Normal judgment and thought content.   DIAGNOSTIC DATA REVIEWED:  BMET    Component Value Date/Time   NA 139 12/20/2021 1135   K 4.0 12/20/2021 1135   CL 97 12/20/2021 1135   CO2 23 12/20/2021 1135   GLUCOSE 99 12/20/2021 1135   GLUCOSE 112 (H) 04/05/2015 0329   BUN 10 12/20/2021 1135   CREATININE 0.79 12/20/2021 1135   CALCIUM 9.7 12/20/2021 1135   GFRNONAA 85 11/21/2020 0000   GFRAA 103 11/21/2020 0000   Lab Results  Component Value Date   HGBA1C 5.9 (H) 12/20/2021   HGBA1C 6.0 03/16/2018   Lab Results  Component Value Date   INSULIN 14.8 06/18/2021   INSULIN 7.4 01/25/2019   CBC    Component Value Date/Time   WBC 4.4 12/20/2021 1135   WBC 4.2 03/23/2015 1525   RBC 4.65 12/20/2021 1135   RBC 4.67 11/21/2020 0000   HGB 13.5 12/20/2021 1135   HCT 39.8 12/20/2021 1135   PLT 402 12/20/2021 1135   MCV 86 12/20/2021 1135   MCH 29.0 12/20/2021 1135   MCH 27.1 03/23/2015 1525   MCHC 33.9 12/20/2021 1135   MCHC 33.6 03/23/2015 1525   RDW 14.2 12/20/2021 1135   Iron/TIBC/Ferritin/ %Sat No results found for: "IRON", "TIBC", "FERRITIN", "IRONPCTSAT" Lipid Panel     Component Value Date/Time   CHOL 187 12/20/2021 1135   TRIG 70 12/20/2021 1135   HDL 63 12/20/2021 1135   CHOLHDL 3.0 12/20/2021  1135   LDLCALC 111 (H) 12/20/2021 1135   Hepatic Function Panel     Component Value Date/Time   PROT 7.5 12/20/2021 1135   ALBUMIN 4.7 12/20/2021 1135   AST 20 12/20/2021 1135   ALT 19 12/20/2021 1135   ALKPHOS 77 12/20/2021 1135   BILITOT 0.5 12/20/2021 1135      Component Value Date/Time   TSH 2.440 12/20/2021 1135     Assessment and Plan:   Post-surgical hypothyroidism Assessment & Plan: Significant weight gain since thyroidectomy in 2017 for non - cancerous reasons Managed by PCP on Synthroid 125 mcg daily.  Lab Results  Component Value Date   TSH 2.440 12/20/2021      Morbid  obesity ([place])  BMI 40.0-44.9, adult [facility])  Prediabetes Assessment & Plan: Lab Results  Component Value Date   HGBA1C 5.9 (H) 12/20/2021   Has never taken metformin. Consuming excess starches with a lack of regular exercise  Check fasting labs next visit.  Plan to reduce intake of added sugar and given info on [place] gym for punch card program.  Consider metformin or GLP- 1 receptor agonist.         Obesity Treatment / Action Plan:  Patient will work on garnering support from family and friends to begin weight loss journey. Will work on eliminating or reducing the presence of highly palatable, calorie dense foods in the home. Will complete provided nutritional and psychosocial assessment questionnaire before the next appointment. Will be scheduled for indirect calorimetry to determine resting energy expenditure in a fasting state.  This will allow [place] to create a reduced calorie, high-protein meal plan to promote loss of fat mass while preserving muscle mass. Will think about ideas on how to incorporate physical activity into their daily routine. Will work on reducing intake of added sugars, simple sugars and processed carbs. Was counseled on nutritional approaches to weight loss and benefits of complex carbs and high quality protein as part of nutritional weight management. Was counseled on pharmacotherapy and role as an adjunct in weight management.   Obesity Education Performed Today:  She was weighed on the bioimpedance scale and results were discussed and documented in the synopsis.  We discussed obesity as a disease and the importance of a more detailed evaluation of all the factors contributing to the disease.  We discussed the importance of long term lifestyle changes which include nutrition, exercise and behavioral modifications as well as the importance of customizing this to her specific health and social needs.  We discussed the benefits of reaching a  healthier weight to alleviate the symptoms of existing conditions and reduce the risks of the biomechanical, metabolic and psychological effects of obesity.  Madiha Freeberg appears to be in the action stage of change and states they are ready to start intensive lifestyle modifications and behavioral modifications.  30 minutes was spent today on this visit including the above counseling, pre-visit chart review, and post-visit documentation.  Reviewed by clinician on day of visit: allergies, medications, problem list, medical history, surgical history, family history, social history, and previous encounter notes pertinent to obesity diagnosis.    Loyal Gambler, D.O. DABFM, DABOM [facility] [phone], [place] [id number] [phone]

## 2022-09-30 NOTE — Assessment & Plan Note (Signed)
Significant weight gain since thyroidectomy in 2017 for non - cancerous reasons Managed by PCP on Synthroid 125 mcg daily.  Lab Results  Component Value Date   TSH 2.440 12/20/2021

## 2022-10-23 ENCOUNTER — Other Ambulatory Visit: Payer: Self-pay | Admitting: Internal Medicine

## 2022-10-23 DIAGNOSIS — Z1231 Encounter for screening mammogram for malignant neoplasm of breast: Secondary | ICD-10-CM

## 2022-11-13 ENCOUNTER — Telehealth (INDEPENDENT_AMBULATORY_CARE_PROVIDER_SITE_OTHER): Payer: Self-pay | Admitting: Family Medicine

## 2022-11-13 ENCOUNTER — Ambulatory Visit (INDEPENDENT_AMBULATORY_CARE_PROVIDER_SITE_OTHER): Payer: 59 | Admitting: Family Medicine

## 2022-11-13 NOTE — Telephone Encounter (Signed)
Patient came into the office today without her new patient packet fully completed.  She was charged a no show fee and did not reschedule.  The patient called stating that she did not realize the complexity of the first appointments and that her work schedule would not allow it at this time.  Patient requested to have program fee refunded and to have no show fee voided. I have requested for fee to be voided and for program fee to be refunded.   Patient will need to pay program fee in order to join program again. I will place restrictions on patients account to alert.

## 2022-11-18 ENCOUNTER — Ambulatory Visit: Payer: 59

## 2022-11-27 ENCOUNTER — Ambulatory Visit (INDEPENDENT_AMBULATORY_CARE_PROVIDER_SITE_OTHER): Payer: 59 | Admitting: Family Medicine

## 2022-12-17 ENCOUNTER — Encounter: Payer: Self-pay | Admitting: Internal Medicine

## 2022-12-23 ENCOUNTER — Encounter: Payer: 59 | Admitting: Internal Medicine

## 2023-03-11 ENCOUNTER — Ambulatory Visit (INDEPENDENT_AMBULATORY_CARE_PROVIDER_SITE_OTHER): Payer: 59 | Admitting: Internal Medicine

## 2023-03-11 ENCOUNTER — Encounter: Payer: Self-pay | Admitting: Internal Medicine

## 2023-03-11 VITALS — BP 120/82 | HR 71 | Temp 98.0°F | Ht 59.0 in | Wt 218.2 lb

## 2023-03-11 DIAGNOSIS — Z6841 Body Mass Index (BMI) 40.0 and over, adult: Secondary | ICD-10-CM

## 2023-03-11 DIAGNOSIS — D329 Benign neoplasm of meninges, unspecified: Secondary | ICD-10-CM | POA: Diagnosis not present

## 2023-03-11 DIAGNOSIS — E890 Postprocedural hypothyroidism: Secondary | ICD-10-CM

## 2023-03-11 DIAGNOSIS — E559 Vitamin D deficiency, unspecified: Secondary | ICD-10-CM | POA: Insufficient documentation

## 2023-03-11 DIAGNOSIS — Z0000 Encounter for general adult medical examination without abnormal findings: Secondary | ICD-10-CM | POA: Diagnosis not present

## 2023-03-11 DIAGNOSIS — E7800 Pure hypercholesterolemia, unspecified: Secondary | ICD-10-CM | POA: Diagnosis not present

## 2023-03-11 MED ORDER — VITAMIN D (ERGOCALCIFEROL) 1.25 MG (50000 UNIT) PO CAPS: ORAL_CAPSULE | ORAL | 0 refills | Status: DC

## 2023-03-11 NOTE — Assessment & Plan Note (Signed)

## 2023-03-11 NOTE — Assessment & Plan Note (Signed)
Chronic, s/p total thyroidectomy in 2016.  I will check thyroid panel and adjust meds as needed. She will continue with Synthroid daily M-Saturday for now. She will f/u in six months for re-evaluation.

## 2023-03-11 NOTE — Progress Notes (Signed)
I,Victoria T Deloria Lair, CMA,acting as a Neurosurgeon for Gwynneth Aliment, MD.,have documented all relevant documentation on the behalf of Gwynneth Aliment, MD,as directed by  Gwynneth Aliment, MD while in the presence of Gwynneth Aliment, MD.  Subjective:    Patient ID: Carla Dixon , female    DOB: 02/11/1961 , 62 y.o.   MRN: [id number]  Chief Complaint  Patient presents with   Annual Exam   Hypothyroidism    HPI  She is here today for a full physical exam. She is followed by Dr. Su Hilt for her Gyn care. She reports compliance with meds. She is also seen at [facility], she does not disclose the type of care she receives, she has gone for weight loss in the past.   She reports wanting to get back in Vit D. She feels her Vit D is low.   Thyroid Problem Presents for follow-up visit. Patient reports no constipation, leg swelling or menstrual problem.     Past Medical History:  Diagnosis Date   Graves' disease    Hypothyroidism    S/P thyroidectomy   Pericardial effusion    Pneumonia "several times"   Pulmonary aspiration of fluid (HCC)      Family History  Problem Relation Age of Onset   Hypertension Mother    Diabetes Mother    Gout Mother    Heart disease Father    Colon cancer Father    Heart Problems Father      Current Outpatient Medications:    SYNTHROID 125 MCG tablet, TAKE 1 TABLET BY MOUTH MONDAY - SATURDAY, Disp: 90 tablet, Rfl: 1   Vitamin D, Ergocalciferol, (DRISDOL) 1.25 MG (50000 UNIT) CAPS capsule, One capsule po once weekly, Disp: 12 capsule, Rfl: 0   valACYclovir (VALTREX) 500 MG tablet, Take 500 mg by mouth 2 (two) times daily. (Patient not taking: Reported on 03/11/2023), Disp: , Rfl:    Allergies  Allergen Reactions   Neosporin Original [Bacitracin-Neomycin-Polymyxin] Rash      The patient states she uses post menopausal status for birth control. No LMP recorded (lmp unknown). Patient is postmenopausal.. Negative for Dysmenorrhea. Negative for:  breast discharge, breast lump(s), breast pain and breast self exam. Associated symptoms include abnormal vaginal bleeding. Pertinent negatives include abnormal bleeding (hematology), anxiety, decreased libido, depression, difficulty falling sleep, dyspareunia, history of infertility, nocturia, sexual dysfunction, sleep disturbances, urinary incontinence, urinary urgency, vaginal discharge and vaginal itching. Diet regular.The patient states her exercise level is  intermittent.  . The patient's tobacco use is:  Social History   Tobacco Use  Smoking Status Never  Smokeless Tobacco Never  . She has been exposed to passive smoke. The patient's alcohol use is:  Social History   Substance and Sexual Activity  Alcohol Use No   Review of Systems  Constitutional: Negative.   HENT: Negative.    Eyes: Negative.   Respiratory: Negative.    Cardiovascular: Negative.   Gastrointestinal: Negative.  Negative for constipation.  Endocrine: Negative.   Genitourinary: Negative.  Negative for menstrual problem.  Musculoskeletal:  Positive for arthralgias.  Skin: Negative.   Allergic/Immunologic: Negative.   Neurological: Negative.   Hematological: Negative.   Psychiatric/Behavioral: Negative.       Today's Vitals   03/11/23 1511  BP: 120/82  Pulse: 71  Temp: 98 F (36.7 C)  SpO2: 98%  Weight: 218 lb 3.2 oz (99 kg)  Height: 4\' 11"  (1.499 m)   Body mass index is 44.07 kg/m.  Wt Readings  from Last 3 Encounters:  03/11/23 218 lb 3.2 oz (99 kg)  09/30/22 219 lb (99.3 kg)  06/24/22 222 lb (100.7 kg)    The 10-year ASCVD risk score (Arnett DK, et al., 2019) is: 4.6%   Values used to calculate the score:     Age: 74 years     Sex: Female     Is Non-Hispanic African American: Yes     Diabetic: No     Tobacco smoker: No     Systolic Blood Pressure: 120 mmHg     Is BP treated: No     HDL Cholesterol: 63 mg/dL     Total Cholesterol: 187 mg/dL   Objective:  Physical Exam Vitals and  nursing note reviewed.  Constitutional:      Appearance: Normal appearance. She is obese.  HENT:     Head: Normocephalic and atraumatic.     Right Ear: Tympanic membrane, ear canal and external ear normal.     Left Ear: Tympanic membrane, ear canal and external ear normal.     Nose: Nose normal.     Mouth/Throat:     Mouth: Mucous membranes are moist.     Pharynx: Oropharynx is clear.  Eyes:     Extraocular Movements: Extraocular movements intact.     Conjunctiva/sclera: Conjunctivae normal.     Pupils: Pupils are equal, round, and reactive to light.  Cardiovascular:     Rate and Rhythm: Normal rate and regular rhythm.     Pulses: Normal pulses.     Heart sounds: Normal heart sounds.  Pulmonary:     Effort: Pulmonary effort is normal.     Breath sounds: Normal breath sounds.  Chest:  Breasts:    Tanner Score is 5.     Right: Normal.     Left: Normal.  Abdominal:     General: Bowel sounds are normal.     Palpations: Abdomen is soft.     Comments: Rounded, soft  Genitourinary:    Comments: deferred Musculoskeletal:        General: Normal range of motion.     Cervical back: Normal range of motion and neck supple.  Skin:    General: Skin is warm and dry.  Neurological:     General: No focal deficit present.     Mental Status: She is alert and oriented to person, place, and time.  Psychiatric:        Mood and Affect: Mood normal.        Behavior: Behavior normal.         Assessment And Plan:     Annual physical exam Assessment & Plan: A full exam was performed.  Importance of monthly self breast exams was discussed with the patient.  She is advised to get 3045 minutes of regular exercise, no less than four to five days per week. Both weight-bearing and aerobic exercises are recommended.  She is advised to follow a healthy diet with at least six fruits/veggies per day, decrease intake of red meat and other saturated fats and to increase fish intake to twice weekly.   Meats/fish should not be fried -- baked, boiled or broiled is preferable. It is also important to cut back on your sugar intake.  Be sure to read labels - try to avoid anything with added sugar, high fructose corn syrup or other sweeteners.  If you must use a sweetener, you can try stevia or monkfruit.  It is also important to avoid artificially sweetened foods/beverages and  diet drinks. Lastly, wear SPF 50 sunscreen on exposed skin and when in direct sunlight for an extended period of time.  Be sure to avoid fast food restaurants and aim for at least 60 ounces of water daily.      Orders: -     CMP14+EGFR -     CBC -     Lipid panel -     Hemoglobin A1c -     TSH + free T4 -     VITAMIN D 25 Hydroxy (Vit-D Deficiency, Fractures)  Pure hypercholesterolemia Assessment & Plan: Chronic, she admits to recent liberal eating. I will check fasting lipid panel today. She does have family history of heart disease. We discussed use of cardiac calcium study to further evaluate cardiac risk. ASCVD risk is 4% with 2023 lipid readings. She agrees to cardiac calcium scoring and is aware of the $99 fee. She agrees to Cardiology evaluation as needed.   Orders: -     CT CARDIAC SCORING (SELF PAY ONLY); Future  Postsurgical hypothyroidism Assessment & Plan: Chronic, s/p total thyroidectomy in 2016.  I will check thyroid panel and adjust meds as needed. She will continue with Synthroid daily M-Saturday for now. She will f/u in six months for re-evaluation.    Meningioma [facility]) Assessment & Plan: Chronic, her last Neuro eval was in 2021. She is not having any new neurologic symptoms. No need for repeat imaging at this time.    Vitamin D deficiency Assessment & Plan: She has noticed an increase in joint pains. I will check vitamin D level and supplement as needed.    Class 3 severe obesity due to excess calories without serious comorbidity with body mass index (BMI) of 40.0 to 44.9 in adult  [facility]) Assessment & Plan: BMI 44.  Previously referred to [facility] clinic by her GYN. She has also been evaluated at [facility] in the past. She is encouraged to aim for at least 150 minutes of exercise per week.    Other orders -     Vitamin D (Ergocalciferol); One capsule po once weekly  Dispense: 12 capsule; Refill: 0  She is encouraged to strive for BMI less than 30 to decrease cardiac risk. Advised to aim for at least 150 minutes of exercise per week.    Return in 14 weeks (on 06/17/2023), or weight check, for 1 year physical. Patient was given opportunity to ask questions. Patient verbalized understanding of the plan and was able to repeat key elements of the plan. All questions were answered to their satisfaction.    I, Gwynneth Aliment, MD, have reviewed all documentation for this visit. The documentation on 03/11/23 for the exam, diagnosis, procedures, and orders are all accurate and complete.

## 2023-03-11 NOTE — Assessment & Plan Note (Signed)
BMI 44.  Previously referred to [facility] clinic by her GYN. She has also been evaluated at [facility] in the past. She is encouraged to aim for at least 150 minutes of exercise per week.

## 2023-03-11 NOTE — Assessment & Plan Note (Signed)
Chronic, her last Neuro eval was in 2021. She is not having any new neurologic symptoms. No need for repeat imaging at this time.

## 2023-03-11 NOTE — Assessment & Plan Note (Addendum)
Chronic, she admits to recent liberal eating. I will check fasting lipid panel today. She does have family history of heart disease. We discussed use of cardiac calcium study to further evaluate cardiac risk. ASCVD risk is 4% with 2023 lipid readings. She agrees to cardiac calcium scoring and is aware of the $99 fee. She agrees to Cardiology evaluation as needed.

## 2023-03-11 NOTE — Patient Instructions (Addendum)
Organic dandelion root tea   Health Maintenance, Female Adopting a healthy lifestyle and getting preventive care are important in promoting health and wellness. Ask your health care provider about: The right schedule for you to have regular tests and exams. Things you can do on your own to prevent diseases and keep yourself healthy. What should I know about diet, weight, and exercise? Eat a healthy diet  Eat a diet that includes plenty of vegetables, fruits, low-fat dairy products, and lean protein. Do not eat a lot of foods that are high in solid fats, added sugars, or sodium. Maintain a healthy weight Body mass index (BMI) is used to identify weight problems. It estimates body fat based on height and weight. Your health care provider can help determine your BMI and help you achieve or maintain a healthy weight. Get regular exercise Get regular exercise. This is one of the most important things you can do for your health. Most adults should: Exercise for at least 150 minutes each week. The exercise should increase your heart rate and make you sweat (moderate-intensity exercise). Do strengthening exercises at least twice a week. This is in addition to the moderate-intensity exercise. Spend less time sitting. Even light physical activity can be beneficial. Watch cholesterol and blood lipids Have your blood tested for lipids and cholesterol at 62 years of age, then have this test every 5 years. Have your cholesterol levels checked more often if: Your lipid or cholesterol levels are high. You are older than 63 years of age. You are at high risk for heart disease. What should I know about cancer screening? Depending on your health history and family history, you may need to have cancer screening at various ages. This may include screening for: Breast cancer. Cervical cancer. Colorectal cancer. Skin cancer. Lung cancer. What should I know about heart disease, diabetes, and high blood  pressure? Blood pressure and heart disease High blood pressure causes heart disease and increases the risk of stroke. This is more likely to develop in people who have high blood pressure readings or are overweight. Have your blood pressure checked: Every 3-5 years if you are 12-62 years of age. Every year if you are 67 years old or older. Diabetes Have regular diabetes screenings. This checks your fasting blood sugar level. Have the screening done: Once every three years after age 46 if you are at a normal weight and have a low risk for diabetes. More often and at a younger age if you are overweight or have a high risk for diabetes. What should I know about preventing infection? Hepatitis B If you have a higher risk for hepatitis B, you should be screened for this virus. Talk with your health care provider to find out if you are at risk for hepatitis B infection. Hepatitis C Testing is recommended for: Everyone born from [birth year] through 1965. Anyone with known risk factors for hepatitis C. Sexually transmitted infections (STIs) Get screened for STIs, including gonorrhea and chlamydia, if: You are sexually active and are younger than 62 years of age. You are older than 62 years of age and your health care provider tells you that you are at risk for this type of infection. Your sexual activity has changed since you were last screened, and you are at increased risk for chlamydia or gonorrhea. Ask your health care provider if you are at risk. Ask your health care provider about whether you are at high risk for HIV. Your health care provider may recommend a  prescription medicine to help prevent HIV infection. If you choose to take medicine to prevent HIV, you should first get tested for HIV. You should then be tested every 3 months for as long as you are taking the medicine. Pregnancy If you are about to stop having your period (premenopausal) and you may become pregnant, seek counseling before you  get pregnant. Take 400 to 800 micrograms (mcg) of folic acid every day if you become pregnant. Ask for birth control (contraception) if you want to prevent pregnancy. Osteoporosis and menopause Osteoporosis is a disease in which the bones lose minerals and strength with aging. This can result in bone fractures. If you are 7 years old or older, or if you are at risk for osteoporosis and fractures, ask your health care provider if you should: Be screened for bone loss. Take a calcium or vitamin D supplement to lower your risk of fractures. Be given hormone replacement therapy (HRT) to treat symptoms of menopause. Follow these instructions at home: Alcohol use Do not drink alcohol if: Your health care provider tells you not to drink. You are pregnant, may be pregnant, or are planning to become pregnant. If you drink alcohol: Limit how much you have to: 0-1 drink a day. Know how much alcohol is in your drink. In the [place] one drink equals one 12 oz bottle of beer (355 mL), one 5 oz glass of wine (148 mL), or one 1 oz glass of hard liquor (44 mL). Lifestyle Do not use any products that contain nicotine or tobacco. These products include cigarettes, chewing tobacco, and vaping devices, such as e-cigarettes. If you need help quitting, ask your health care provider. Do not use street drugs. Do not share needles. Ask your health care provider for help if you need support or information about quitting drugs. General instructions Schedule regular health, dental, and eye exams. Stay current with your vaccines. Tell your health care provider if: You often feel depressed. You have ever been abused or do not feel safe at home. Summary Adopting a healthy lifestyle and getting preventive care are important in promoting health and wellness. Follow your health care provider's instructions about healthy diet, exercising, and getting tested or screened for diseases. Follow your health care provider's  instructions on monitoring your cholesterol and blood pressure. This information is not intended to replace advice given to you by your health care provider. Make sure you discuss any questions you have with your health care provider. Document Revised: 11/27/2020 Document Reviewed: 11/27/2020 Elsevier Patient Education  2024 [employer].

## 2023-03-11 NOTE — Assessment & Plan Note (Signed)
She has noticed an increase in joint pains. I will check vitamin D level and supplement as needed.

## 2023-03-12 LAB — CMP14+EGFR
ALT: 16 IU/L (ref 0–32)
AST: 21 IU/L (ref 0–40)
Albumin: 4.6 g/dL (ref 3.9–4.9)
Alkaline Phosphatase: 79 IU/L (ref 44–121)
BUN/Creatinine Ratio: 12 (ref 12–28)
BUN: 8 mg/dL (ref 8–27)
Bilirubin Total: 0.4 mg/dL (ref 0.0–1.2)
CO2: 25 mmol/L (ref 20–29)
Calcium: 9.4 mg/dL (ref 8.7–10.3)
Chloride: 99 mmol/L (ref 96–106)
Creatinine, Ser: 0.69 mg/dL (ref 0.57–1.00)
Globulin, Total: 2.9 g/dL (ref 1.5–4.5)
Glucose: 86 mg/dL (ref 70–99)
Potassium: 4.2 mmol/L (ref 3.5–5.2)
Sodium: 140 mmol/L (ref 134–144)
Total Protein: 7.5 g/dL (ref 6.0–8.5)
eGFR: 98 mL/min/{1.73_m2} (ref >59)

## 2023-03-12 LAB — CBC
Hematocrit: 41 % (ref 34.0–46.6)
Hemoglobin: 13.7 g/dL (ref 11.1–15.9)
MCH: 28 pg (ref 26.6–33.0)
MCHC: 33.4 g/dL (ref 31.5–35.7)
MCV: 84 fL (ref 79–97)
Platelets: 413 10*3/uL (ref 150–450)
RBC: 4.89 x10E6/uL (ref 3.77–5.28)
RDW: 13.8 % (ref 11.7–15.4)
WBC: 5 10*3/uL (ref 3.4–10.8)

## 2023-03-12 LAB — VITAMIN D 25 HYDROXY (VIT D DEFICIENCY, FRACTURES): Vit D, 25-Hydroxy: 27.7 ng/mL — ABNORMAL LOW (ref 30.0–100.0)

## 2023-03-12 LAB — LIPID PANEL
Chol/HDL Ratio: 3.2 ratio (ref 0.0–4.4)
Cholesterol, Total: 184 mg/dL (ref 100–199)
HDL: 57 mg/dL (ref >39)
LDL Chol Calc (NIH): 112 mg/dL — ABNORMAL HIGH (ref 0–99)
Triglycerides: 80 mg/dL (ref 0–149)
VLDL Cholesterol Cal: 15 mg/dL (ref 5–40)

## 2023-03-12 LAB — TSH+FREE T4
Free T4: 1.8 ng/dL — ABNORMAL HIGH (ref 0.82–1.77)
TSH: 3.09 u[IU]/mL (ref 0.450–4.500)

## 2023-03-12 LAB — HEMOGLOBIN A1C
Est. average glucose Bld gHb Est-mCnc: 134 mg/dL
Hgb A1c MFr Bld: 6.3 % — ABNORMAL HIGH (ref 4.8–5.6)

## 2023-04-10 ENCOUNTER — Ambulatory Visit (HOSPITAL_BASED_OUTPATIENT_CLINIC_OR_DEPARTMENT_OTHER)
Admission: RE | Admit: 2023-04-10 | Discharge: 2023-04-10 | Disposition: A | Discharge status: Home / Self Care | Payer: 59 | Source: Ambulatory Visit | Attending: Internal Medicine | Admitting: Internal Medicine

## 2023-04-10 DIAGNOSIS — E7800 Pure hypercholesterolemia, unspecified: Secondary | ICD-10-CM | POA: Insufficient documentation

## 2023-04-12 ENCOUNTER — Other Ambulatory Visit: Payer: Self-pay | Admitting: Internal Medicine

## 2023-05-27 ENCOUNTER — Other Ambulatory Visit: Payer: Self-pay | Admitting: Internal Medicine

## 2023-06-17 ENCOUNTER — Encounter: Payer: Self-pay | Admitting: Internal Medicine

## 2023-06-17 ENCOUNTER — Ambulatory Visit: Payer: 59 | Admitting: Internal Medicine

## 2023-06-17 VITALS — BP 118/80 | HR 78 | Temp 98.0°F | Ht 59.0 in | Wt 216.2 lb

## 2023-06-17 DIAGNOSIS — E890 Postprocedural hypothyroidism: Secondary | ICD-10-CM

## 2023-06-17 DIAGNOSIS — Z23 Encounter for immunization: Secondary | ICD-10-CM

## 2023-06-17 DIAGNOSIS — R7303 Prediabetes: Secondary | ICD-10-CM

## 2023-06-17 DIAGNOSIS — E66813 Obesity, class 3: Secondary | ICD-10-CM

## 2023-06-17 DIAGNOSIS — Z6841 Body Mass Index (BMI) 40.0 and over, adult: Secondary | ICD-10-CM

## 2023-06-17 NOTE — Assessment & Plan Note (Signed)
Chronic, currently on Synthroid daily M-Saturday, skipping Sundays. I will check thyroid panel and adjust meds as needed.

## 2023-06-17 NOTE — Assessment & Plan Note (Signed)
Previous labs reviewed, her A1c has been elevated in the past. I will check an A1c today. Reminded to avoid refined sugars including sugary drinks/foods and processed meats including bacon, sausages and deli meats.  

## 2023-06-17 NOTE — Progress Notes (Signed)
I,Victoria T Deloria Lair, CMA,acting as a Neurosurgeon for Gwynneth Aliment, MD.,have documented all relevant documentation on the behalf of Gwynneth Aliment, MD,as directed by  Gwynneth Aliment, MD while in the presence of Gwynneth Aliment, MD.  Subjective:  Patient ID: Carla Dixon , female    DOB: [birth year] , 62 y.o.   MRN: [id number]  Chief Complaint  Patient presents with   Weight Check    HPI  Patient presents today for thyroid f/u and weight check. She reports compliance with medications. Denies headaches, chest pain & sob. She admits she is not yet exercising regularly. She plans to start a regular exercise regimen in the near future. She has no specific concerns at this time. However, states she is ready for referral to [facility]. She was contacted to join in the past; however, the timing was off.      Past Medical History:  Diagnosis Date   Graves' disease    Hypothyroidism    S/P thyroidectomy   Pericardial effusion    Pneumonia "several times"   Pulmonary aspiration of fluid (HCC)      Family History  Problem Relation Age of Onset   Hypertension Mother    Diabetes Mother    Gout Mother    Heart disease Father    Colon cancer Father    Heart Problems Father      Current Outpatient Medications:    SYNTHROID 125 MCG tablet, TAKE 1 TABLET BY MOUTH MONDAY - SATURDAY, Disp: 90 tablet, Rfl: 1   Vitamin D, Ergocalciferol, (DRISDOL) 1.25 MG (50000 UNIT) CAPS capsule, TAKE ONE CAPSULE ONCE WEEKLY, Disp: 12 capsule, Rfl: 0   valACYclovir (VALTREX) 500 MG tablet, Take 500 mg by mouth 2 (two) times daily. (Patient not taking: Reported on 03/11/2023), Disp: , Rfl:    Allergies  Allergen Reactions   Neosporin Original [Bacitracin-Neomycin-Polymyxin] Rash     Review of Systems  Constitutional: Negative.   Respiratory: Negative.    Cardiovascular: Negative.   Gastrointestinal: Negative.   Neurological: Negative.   Psychiatric/Behavioral: Negative.       Today's Vitals    06/17/23 1500  BP: 118/80  Pulse: 78  Temp: 98 F (36.7 C)  SpO2: 98%  Weight: 216 lb 3.2 oz (98.1 kg)  Height: 4\' 11"  (1.499 m)   Body mass index is 43.67 kg/m.  Wt Readings from Last 3 Encounters:  06/17/23 216 lb 3.2 oz (98.1 kg)  03/11/23 218 lb 3.2 oz (99 kg)  09/30/22 219 lb (99.3 kg)     Objective:  Physical Exam Vitals and nursing note reviewed.  Constitutional:      Appearance: Normal appearance. She is obese.  HENT:     Head: Normocephalic and atraumatic.  Eyes:     Extraocular Movements: Extraocular movements intact.  Cardiovascular:     Rate and Rhythm: Normal rate and regular rhythm.     Heart sounds: Normal heart sounds.  Pulmonary:     Effort: Pulmonary effort is normal.     Breath sounds: Normal breath sounds.  Musculoskeletal:     Cervical back: Normal range of motion.  Skin:    General: Skin is warm.  Neurological:     General: No focal deficit present.     Mental Status: She is alert.  Psychiatric:        Mood and Affect: Mood normal.        Behavior: Behavior normal.         Assessment And Plan:  Postsurgical  hypothyroidism Assessment & Plan: Chronic, currently on Synthroid daily M-Saturday, skipping Sundays. I will check thyroid panel and adjust meds as needed.   Orders: -     TSH + free T4  Class 3 severe obesity due to excess calories without serious comorbidity with body mass index (BMI) of 40.0 to 44.9 in adult [facility]) Assessment & Plan: BMI 43.  I will refer her to [facility] clinic as requested.  She is encouraged to aim for at least 150 minutes of exercise per week. She can start with 15 minutes three days per week and work up to 30 minutes five days per week.   Orders: -     Amb Ref to Medical Weight Management  Prediabetes Assessment & Plan: Previous labs reviewed, her A1c has been elevated in the past. I will check an A1c today. Reminded to avoid refined sugars including sugary drinks/foods and processed meats including  bacon, sausages and deli meats.    Orders: -     Hemoglobin A1c  Immunization due -     Flu vaccine trivalent PF, 6mos and older(Flulaval,Afluria,Fluarix,Fluzone)     Return in 18 weeks (on 10/21/2023), or predm check.  Patient was given opportunity to ask questions. Patient verbalized understanding of the plan and was able to repeat key elements of the plan. All questions were answered to their satisfaction.    I, Gwynneth Aliment, MD, have reviewed all documentation for this visit. The documentation on 06/17/23 for the exam, diagnosis, procedures, and orders are all accurate and complete.   IF YOU HAVE BEEN REFERRED TO A SPECIALIST, IT MAY TAKE 1-2 WEEKS TO SCHEDULE/PROCESS THE REFERRAL. IF YOU HAVE NOT HEARD FROM US/SPECIALIST IN TWO WEEKS, PLEASE GIVE [place] A CALL AT [phone] X [phone].   THE PATIENT IS ENCOURAGED TO PRACTICE SOCIAL DISTANCING DUE TO THE COVID-19 PANDEMIC.

## 2023-06-17 NOTE — Patient Instructions (Addendum)
Dandelion root tea (organic)  Exercising to Lose Weight Getting regular exercise is important for everyone. It is especially important if you are overweight. Being overweight increases your risk of heart disease, stroke, diabetes, high blood pressure, and several types of cancer. Exercising, and reducing the calories you consume, can help you lose weight and improve fitness and health. Exercise can be moderate or vigorous intensity. To lose weight, most people need to do a certain amount of moderate or vigorous-intensity exercise each week. How can exercise affect me? You lose weight when you exercise enough to burn more calories than you eat. Exercise also reduces body fat and builds muscle. The more muscle you have, the more calories you burn. Exercise also: Improves mood. Reduces stress and tension. Improves your overall fitness, flexibility, and endurance. Increases bone strength. Moderate-intensity exercise  Moderate-intensity exercise is any activity that gets you moving enough to burn at least three times more energy (calories) than if you were sitting. Examples of moderate exercise include: Walking a mile in 15 minutes. Doing light yard work. Biking at an easy pace. Most people should get at least 150 minutes of moderate-intensity exercise a week to maintain their body weight. Vigorous-intensity exercise Vigorous-intensity exercise is any activity that gets you moving enough to burn at least six times more calories than if you were sitting. When you exercise at this intensity, you should be working hard enough that you are not able to carry on a conversation. Examples of vigorous exercise include: Running. Playing a team sport, such as football, basketball, and soccer. Jumping rope. Most people should get at least 75 minutes a week of vigorous exercise to maintain their body weight. What actions can I take to lose weight? The amount of exercise you need to lose weight depends  on: Your age. The type of exercise. Any health conditions you have. Your overall physical ability. Talk to your health care provider about how much exercise you need and what types of activities are safe for you. Nutrition  Make changes to your diet as told by your health care provider or diet and nutrition specialist (dietitian). This may include: Eating fewer calories. Eating more protein. Eating less unhealthy fats. Eating a diet that includes fresh fruits and vegetables, whole grains, low-fat dairy products, and lean protein. Avoiding foods with added fat, salt, and sugar. Drink plenty of water while you exercise to prevent dehydration or heat stroke. Activity Choose an activity that you enjoy and set realistic goals. Your health care provider can help you make an exercise plan that works for you. Exercise at a moderate or vigorous intensity most days of the week. The intensity of exercise may vary from person to person. You can tell how intense a workout is for you by paying attention to your breathing and heartbeat. Most people will notice their breathing and heartbeat get faster with more intense exercise. Do resistance training twice each week, such as: Push-ups. Sit-ups. Lifting weights. Using resistance bands. Getting short amounts of exercise can be just as helpful as long, structured periods of exercise. If you have trouble finding time to exercise, try doing these things as part of your daily routine: Get up, stretch, and walk around every 30 minutes throughout the day. Go for a walk during your lunch break. Park your car farther away from your destination. If you take public transportation, get off one stop early and walk the rest of the way. Make phone calls while standing up and walking around. Take the stairs  instead of elevators or escalators. Wear comfortable clothes and shoes with good support. Do not exercise so much that you hurt yourself, feel dizzy, or get very  short of breath. Where to find more information [place] Department of Health and Human Services: ThisPath.fi Centers for Disease Control and Prevention: FootballExhibition.com.br Contact a health care provider: Before starting a new exercise program. If you have questions or concerns about your weight. If you have a medical problem that keeps you from exercising. Get help right away if: You have any of the following while exercising: Injury. Dizziness. Difficulty breathing or shortness of breath that does not go away when you stop exercising. Chest pain. Rapid heartbeat. These symptoms may represent a serious problem that is an emergency. Do not wait to see if the symptoms will go away. Get medical help right away. Call your local emergency services (911 in the [place] Do not drive yourself to the hospital. Summary Getting regular exercise is especially important if you are overweight. Being overweight increases your risk of heart disease, stroke, diabetes, high blood pressure, and several types of cancer. Losing weight happens when you burn more calories than you eat. Reducing the amount of calories you eat, and getting regular moderate or vigorous exercise each week, helps you lose weight. This information is not intended to replace advice given to you by your health care provider. Make sure you discuss any questions you have with your health care provider. Document Revised: 09/03/2020 Document Reviewed: 09/03/2020 Elsevier Patient Education  2024 [employer].

## 2023-06-17 NOTE — Assessment & Plan Note (Signed)
BMI 43.  I will refer her to [facility] clinic as requested.  She is encouraged to aim for at least 150 minutes of exercise per week. She can start with 15 minutes three days per week and work up to 30 minutes five days per week.

## 2023-06-18 LAB — TSH+FREE T4
Free T4: 1.91 ng/dL — ABNORMAL HIGH (ref 0.82–1.77)
TSH: 1.72 u[IU]/mL (ref 0.450–4.500)

## 2023-06-18 LAB — HEMOGLOBIN A1C
Est. average glucose Bld gHb Est-mCnc: 131 mg/dL
Hgb A1c MFr Bld: 6.2 % — ABNORMAL HIGH (ref 4.8–5.6)

## 2023-09-24 ENCOUNTER — Encounter (INDEPENDENT_AMBULATORY_CARE_PROVIDER_SITE_OTHER): Payer: Self-pay

## 2023-10-18 ENCOUNTER — Other Ambulatory Visit: Payer: Self-pay | Admitting: Internal Medicine

## 2023-10-21 ENCOUNTER — Ambulatory Visit: Payer: 59 | Admitting: Internal Medicine

## 2023-11-24 ENCOUNTER — Encounter: Payer: Self-pay | Admitting: Internal Medicine

## 2023-11-24 ENCOUNTER — Ambulatory Visit: Admitting: Internal Medicine

## 2023-11-24 VITALS — BP 110/74 | HR 78 | Temp 98.3°F | Ht 59.0 in | Wt 220.2 lb

## 2023-11-24 DIAGNOSIS — R7303 Prediabetes: Secondary | ICD-10-CM

## 2023-11-24 DIAGNOSIS — E890 Postprocedural hypothyroidism: Secondary | ICD-10-CM | POA: Diagnosis not present

## 2023-11-24 DIAGNOSIS — M2550 Pain in unspecified joint: Secondary | ICD-10-CM | POA: Diagnosis not present

## 2023-11-24 DIAGNOSIS — Z6841 Body Mass Index (BMI) 40.0 and over, adult: Secondary | ICD-10-CM

## 2023-11-24 DIAGNOSIS — E559 Vitamin D deficiency, unspecified: Secondary | ICD-10-CM

## 2023-11-24 DIAGNOSIS — E66813 Obesity, class 3: Secondary | ICD-10-CM

## 2023-11-24 NOTE — Assessment & Plan Note (Signed)
 Emphasized diet and exercise for management. Advised against one meal a day to prevent metabolic slowdown. - Order routine blood work. - Encourage more fruits and vegetables. - Advise against one meal a day. - Suggest starting day with protein shake using plant-based milk or water. - Encourage regular physical activity, such as walking after dinner.

## 2023-11-24 NOTE — Assessment & Plan Note (Signed)
 Chronic, currently on Synthroid daily M-Saturday, skipping Sundays. I will check thyroid panel and adjust meds as needed.

## 2023-11-24 NOTE — Assessment & Plan Note (Signed)
 Discussed ergonomic issues and previous orthopedic evaluation. Recommended wrist splint and proper footwear. - Order arthritis panel. - Encourage use of wrist splint during computer use. - Discuss importance of proper footwear for ankle pain.

## 2023-11-24 NOTE — Assessment & Plan Note (Signed)
 Vitamin D  deficiency noted. Discussed supplementation and timing to avoid interference with thyroid  medication. - Start vitamin D  supplementation, 5000 units daily. - Take with dinner for better absorption. - Use capsules instead of tablets.

## 2023-11-24 NOTE — Assessment & Plan Note (Signed)
 BMI 44.  She is encouraged to aim for at least 150 minutes of exercise per week. She can start with 15 minutes three days per week and work up to 30 minutes five days per week.

## 2023-11-24 NOTE — Patient Instructions (Addendum)
 Vitamin D3 2000 international units  daily  Prediabetes: What to Know Prediabetes is when your blood sugar, also called glucose, is at a higher level than normal but not high enough for you to be diagnosed with type 2 diabetes (type 2 diabetes mellitus). Having prediabetes puts you at risk for getting type 2 diabetes. By making some healthy changes, you may be able to prevent or delay getting type 2 diabetes. This is important because type 2 diabetes can lead to serious problems. Some of these include: Heart disease. Stroke. Blindness. Kidney disease. Depression. Poor blood flow in the feet and legs. In very bad cases, this could lead to having a leg removed by surgery (amputation). What are the causes? The exact cause of prediabetes isn't known. It may result from insulin  resistance. Insulin  resistance happens when cells in the body don't respond properly to insulin  that the body makes. This can cause too much sugar to build up in the blood. High blood sugar, also called hyperglycemia, can develop. What increases the risk? Having a family member with type 2 diabetes. Being older than 63 years of age. Having had a temporary form of diabetes during a pregnancy. This is called gestational diabetes. Having had polycystic ovary syndrome (PCOS). Being overweight or obese. Being inactive and not getting much exercise. Having a history of heart disease. This may include problems with cholesterol levels, high levels of blood fats, or high blood pressure. What are the signs or symptoms? You may have no symptoms. If you do have symptoms, they may include: Increased hunger. Increased thirst. Needing to pee more often. Changes in how you see, like blurry vision. Feeling tired. How is this diagnosed? Prediabetes can be diagnosed with blood tests that check your blood sugar. One or more of these tests may be done: A fasting blood glucose (FBG) test. You won't be allowed to eat (you will fast) for at  least 8 hours before a blood sample is taken. An A1C blood test, also called a hemoglobin A1C test. This test shows information about blood sugar levels over the past 2?3 months. An oral glucose tolerance test (OGTT). This test measures your blood sugar at two points in time: After you haven't eaten for a while. This is your baseline level. Two hours after you drink a beverage that has sugar in it. You may be diagnosed with prediabetes if: Your FBG is 100?125 mg/dL (2.8-4.1 mmol/L). Your A1C level is 5.7?6.4% (39-46 mmol/mol). Your OGTT result is 140?199 mg/dL (3.2-44 mmol/L). These blood tests may need to be done again to be sure of the diagnosis. How is this treated? Treatment may include making changes to your diet and lifestyle. These changes can help lower your blood sugar and keep you from getting type 2 diabetes. In some cases, medicine may be given to help lower your risk. Follow these instructions at home: Eating and drinking  Eat and drink as told. Follow a healthy meal plan. This includes eating lean proteins, whole grains, legumes, fresh fruits and vegetables, low-fat dairy products, and healthy fats. Meet with an expert in healthy eating called a dietitian. This person can help create a healthy eating plan that's right for you. Lifestyle Do moderate-intensity exercise. Do this for at least 30 minutes a day on 5 or more days each week, or as told by your health care provider. A mix of activities may be best. Good choices include brisk walking, swimming, biking, and weight lifting. Try to lose weight if your provider says it's  OK. Losing 5-7% of your body weight can help reverse insulin  resistance. Do not drink alcohol if: Your provider tells you not to drink. You're pregnant, may be pregnant, or plan to become pregnant. If you drink alcohol: Limit how much you have to: 0-1 drink a day if you're female. 0-2 drinks a day if you're female. Know how much alcohol is in your drink.  In the [place] one drink is one 12 oz bottle of beer (355 mL), one 5 oz glass of wine (148 mL), or one 1 oz glass of hard liquor (44 mL). General instructions Take medicines only as told. You may be given medicines that help lower the risk of type 2 diabetes. Do not smoke, vape, or use nicotine or tobacco. Where to find more information American Diabetes Association: diabetes.org/about-diabetes/prediabetes Academy of Nutrition and Dietetics: eatright.org American Heart Association: Go to ThisJobs.cz. Click the search icon. Type "prediabetes" in the search box. Contact a health care provider if: You have any of these symptoms: Increased hunger. Peeing more often than usual. Increased thirst. Feeling tired. Changes in how you see, like blurry vision. Feeling like you may throw up. Throwing up. Get help right away if: You have shortness of breath. You feel confused. This information is not intended to replace advice given to you by your health care provider. Make sure you discuss any questions you have with your health care provider. Document Revised: 02/09/2023 Document Reviewed: 02/09/2023 Elsevier Patient Education  2024 [employer].

## 2023-11-24 NOTE — Progress Notes (Signed)
 I,Carla Dixon, CMA,acting as a Neurosurgeon for Smiley Dung, MD.,have documented all relevant documentation on the behalf of Smiley Dung, MD,as directed by  Smiley Dung, MD while in the presence of Smiley Dung, MD.  Subjective:  Patient ID: Carla Dixon , female    DOB: 1960/10/18 , 63 y.o.   MRN: [id number]  Chief Complaint  Patient presents with   Prediabetes    Patient presents today for prediabetes follow up. She currently does not take any prescribed medications for predm. Denies headache, chest pain & sob.     HPI Discussed the use of AI scribe software for clinical note transcription with the patient, who gave verbal consent to proceed.  History of Present Illness Carla Dixon is a 63 year old female who presents for a follow-up visit regarding prediabetes.  She is managing her prediabetes through diet and exercise, without specific medication. She is incorporating more fruits and vegetables into her diet and is considering reducing meat consumption. She typically eats one meal a day, believing it helps her insulin  levels.    She takes Synthroid  125 mcg from Monday through Saturday for her thyroid  condition. 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  She experiences inflammation and joint pain, particularly in her wrist and ankle. She attributes wrist pain to poor ergonomics and finds some relief with a wrist splint. No tingling in her fingers and no issues with opening jars or doors. An orthopedic specialist previously recommended a boot for her ankle discomfort, but she prefers using a brace.  She acknowledges low vitamin D  levels in the past and has been prescribed vitamin D , which she took inconsistently. She is considering taking over-the-counter vitamin D  supplements. She is also aware of the need to increase her water intake and is cautious about artificial sweeteners.     Thyroid   Problem Presents for follow-up visit. Patient reports no cold intolerance, nail problem or palpitations. The symptoms have been stable.     Past Medical History:  Diagnosis Date   Graves' disease    Hypothyroidism    S/P thyroidectomy   Pericardial effusion    Pneumonia "several times"   Pulmonary aspiration of fluid (HCC)      Family History  Problem Relation Age of Onset   Hypertension Mother    Diabetes Mother    Gout Mother    Heart disease Father    Colon cancer Father    Heart Problems Father      Current Outpatient Medications:    SYNTHROID  125 MCG tablet, TAKE 1 TABLET BY MOUTH MONDAY - SATURDAY, Disp: 90 tablet, Rfl: 1   valACYclovir (VALTREX) 500 MG tablet, Take 500 mg by mouth 2 (two) times daily. (Patient not taking: Reported on 11/24/2023), Disp: , Rfl:    Vitamin D , Ergocalciferol , (DRISDOL ) 1.25 MG (50000 UNIT) CAPS capsule, TAKE ONE CAPSULE ONCE WEEKLY (Patient not taking: Reported on 11/24/2023), Disp: 12 capsule, Rfl: 0   Allergies  Allergen Reactions   Neosporin Original [Bacitracin-Neomycin-Polymyxin] Rash     Review of Systems  Constitutional: Negative.   Respiratory: Negative.    Cardiovascular: Negative.  Negative for palpitations.  Gastrointestinal: Negative.   Endocrine: Negative for cold intolerance.  Neurological: Negative.   Psychiatric/Behavioral: Negative.       Today's Vitals   11/24/23 1506  BP: 110/74  Pulse: 78  Temp: 98.3 F (36.8 C)  SpO2: 98%  Weight: 220 lb 3.2  oz (99.9 kg)  Height: 4\' 11"  (1.499 m)   Body mass index is 44.48 kg/m.  Wt Readings from Last 3 Encounters:  11/24/23 220 lb 3.2 oz (99.9 kg)  06/17/23 216 lb 3.2 oz (98.1 kg)  03/11/23 218 lb 3.2 oz (99 kg)     Objective:  Physical Exam Vitals and nursing note reviewed.  Constitutional:      Appearance: Normal appearance. She is obese.  HENT:     Head: Normocephalic and atraumatic.  Eyes:     Extraocular Movements: Extraocular movements intact.   Cardiovascular:     Rate and Rhythm: Normal rate and regular rhythm.     Heart sounds: Normal heart sounds.  Pulmonary:     Effort: Pulmonary effort is normal.     Breath sounds: Normal breath sounds.  Musculoskeletal:     Cervical back: Normal range of motion.  Skin:    General: Skin is warm.  Neurological:     General: No focal deficit present.     Mental Status: She is alert.  Psychiatric:        Mood and Affect: Mood normal.        Behavior: Behavior normal.         Assessment And Plan:  Prediabetes Assessment & Plan: Emphasized diet and exercise for management. Advised against one meal a day to prevent metabolic slowdown. - Order routine blood work. - Encourage more fruits and vegetables. - Advise against one meal a day. - Suggest starting day with protein shake using plant-based milk or water. - Encourage regular physical activity, such as walking after dinner.  Orders: -     CMP14+EGFR -     Hemoglobin A1c  Postsurgical hypothyroidism Assessment & Plan: Chronic, currently on Synthroid  125mcg daily M-Saturday, skipping Sundays. I will check thyroid  panel and adjust meds as needed.   Orders: -     TSH + free T4  Arthralgia, unspecified joint Assessment & Plan: Discussed ergonomic issues and previous orthopedic evaluation. Recommended wrist splint and proper footwear. - Order arthritis panel. - Encourage use of wrist splint during computer use. - Discuss importance of proper footwear for ankle pain.  Orders: -     ANA, IFA (with reflex) -     CYCLIC CITRUL PEPTIDE ANTIBODY, IGG/IGA -     Rheumatoid factor -     Sedimentation rate -     Uric acid  Vitamin D  deficiency disease Assessment & Plan: Vitamin D  deficiency noted. Discussed supplementation and timing to avoid interference with thyroid  medication. - Start vitamin D  supplementation, 5000 units daily. - Take with dinner for better absorption. - Use capsules instead of tablets.  Orders: -      VITAMIN D  25 Hydroxy (Vit-D Deficiency, Fractures)  Class 3 severe obesity due to excess calories without serious comorbidity with body mass index (BMI) of 40.0 to 44.9 in adult Assessment & Plan: BMI 44.  She is encouraged to aim for at least 150 minutes of exercise per week. She can start with 15 minutes three days per week and work up to 30 minutes five days per week.     Return if symptoms worsen or fail to improve.  Patient was given opportunity to ask questions. Patient verbalized understanding of the plan and was able to repeat key elements of the plan. All questions were answered to their satisfaction.   I, Smiley Dung, MD, have reviewed all documentation for this visit. The documentation on 11/24/23 for the exam, diagnosis, procedures, and  orders are all accurate and complete.   IF YOU HAVE BEEN REFERRED TO A SPECIALIST, IT MAY TAKE 1-2 WEEKS TO SCHEDULE/PROCESS THE REFERRAL. IF YOU HAVE NOT HEARD FROM US /SPECIALIST IN TWO WEEKS, PLEASE GIVE US  A CALL AT [phone] X [phone].   THE PATIENT IS ENCOURAGED TO PRACTICE SOCIAL DISTANCING DUE TO THE COVID-19 PANDEMIC.

## 2023-12-02 LAB — CMP14+EGFR
ALT: 19 IU/L (ref 0–32)
AST: 20 IU/L (ref 0–40)
Albumin: 4.4 g/dL (ref 3.9–4.9)
Alkaline Phosphatase: 85 IU/L (ref 44–121)
BUN/Creatinine Ratio: 11 — ABNORMAL LOW (ref 12–28)
BUN: 8 mg/dL (ref 8–27)
Bilirubin Total: 0.4 mg/dL (ref 0.0–1.2)
CO2: 22 mmol/L (ref 20–29)
Calcium: 9.3 mg/dL (ref 8.7–10.3)
Chloride: 99 mmol/L (ref 96–106)
Creatinine, Ser: 0.7 mg/dL (ref 0.57–1.00)
Globulin, Total: 2.8 g/dL (ref 1.5–4.5)
Glucose: 87 mg/dL (ref 70–99)
Potassium: 4.1 mmol/L (ref 3.5–5.2)
Sodium: 140 mmol/L (ref 134–144)
Total Protein: 7.2 g/dL (ref 6.0–8.5)
eGFR: 97 mL/min/{1.73_m2} (ref >59)

## 2023-12-02 LAB — TSH+FREE T4
Free T4: 1.71 ng/dL (ref 0.82–1.77)
TSH: 2.36 u[IU]/mL (ref 0.450–4.500)

## 2023-12-02 LAB — ANTINUCLEAR ANTIBODIES, IFA: ANA Titer 1: NEGATIVE

## 2023-12-02 LAB — URIC ACID: Uric Acid: 5.1 mg/dL (ref 3.0–7.2)

## 2023-12-02 LAB — HEMOGLOBIN A1C
Est. average glucose Bld gHb Est-mCnc: 128 mg/dL
Hgb A1c MFr Bld: 6.1 % — ABNORMAL HIGH (ref 4.8–5.6)

## 2023-12-02 LAB — RHEUMATOID FACTOR: Rheumatoid fact SerPl-aCnc: <10 [IU]/mL (ref <14.0)

## 2023-12-02 LAB — VITAMIN D 25 HYDROXY (VIT D DEFICIENCY, FRACTURES): Vit D, 25-Hydroxy: 28.3 ng/mL — ABNORMAL LOW (ref 30.0–100.0)

## 2023-12-02 LAB — CYCLIC CITRUL PEPTIDE ANTIBODY, IGG/IGA: Cyclic Citrullin Peptide Ab: 11 U (ref 0–19)

## 2023-12-02 LAB — SEDIMENTATION RATE: Sed Rate: 60 mm/h — ABNORMAL HIGH (ref 0–40)

## 2023-12-04 ENCOUNTER — Ambulatory Visit: Payer: Self-pay | Admitting: Internal Medicine

## 2024-03-16 ENCOUNTER — Encounter: Payer: Self-pay | Admitting: Internal Medicine

## 2024-05-13 LAB — HM COLONOSCOPY

## 2024-06-24 ENCOUNTER — Encounter: Payer: Self-pay | Admitting: Internal Medicine

## 2024-08-07 ENCOUNTER — Other Ambulatory Visit: Payer: Self-pay | Admitting: Internal Medicine

## 2024-11-02 ENCOUNTER — Encounter: Payer: Self-pay | Admitting: Internal Medicine
